# Patient Record
Sex: Male | Born: 1950 | Race: Black or African American | Hispanic: No | Smoking: Never smoker
Health system: Southern US, Community
[De-identification: ages and names within clinical notes are randomized; demographics above are authoritative.]

## PROBLEM LIST (undated history)

## (undated) DIAGNOSIS — I219 Acute myocardial infarction, unspecified: Secondary | ICD-10-CM

## (undated) DIAGNOSIS — I519 Heart disease, unspecified: Secondary | ICD-10-CM

## (undated) DIAGNOSIS — I10 Essential (primary) hypertension: Secondary | ICD-10-CM

## (undated) DIAGNOSIS — T7840XA Allergy, unspecified, initial encounter: Secondary | ICD-10-CM

## (undated) DIAGNOSIS — E785 Hyperlipidemia, unspecified: Secondary | ICD-10-CM

## (undated) DIAGNOSIS — E119 Type 2 diabetes mellitus without complications: Secondary | ICD-10-CM

## (undated) HISTORY — DX: Acute myocardial infarction, unspecified: I21.9

## (undated) HISTORY — DX: Allergy, unspecified, initial encounter: T78.40XA

## (undated) HISTORY — DX: Type 2 diabetes mellitus without complications: E11.9

## (undated) HISTORY — PX: CARDIAC CATHETERIZATION: SHX172

## (undated) HISTORY — DX: Hyperlipidemia, unspecified: E78.5

## (undated) HISTORY — DX: Heart disease, unspecified: I51.9

## (undated) HISTORY — DX: Essential (primary) hypertension: I10

## (undated) HISTORY — PX: OTHER SURGICAL HISTORY: SHX169

---

## 2016-09-04 ENCOUNTER — Encounter: Payer: Self-pay | Admitting: Family Medicine

## 2018-05-30 ENCOUNTER — Encounter: Payer: Self-pay | Admitting: Family Medicine

## 2019-05-21 ENCOUNTER — Other Ambulatory Visit: Payer: Self-pay

## 2019-05-21 DIAGNOSIS — Z20822 Contact with and (suspected) exposure to covid-19: Secondary | ICD-10-CM

## 2019-05-23 LAB — NOVEL CORONAVIRUS, NAA: SARS-CoV-2, NAA: NOT DETECTED

## 2019-05-25 ENCOUNTER — Telehealth: Payer: Self-pay | Admitting: General Practice

## 2019-05-25 NOTE — Telephone Encounter (Signed)
Negative COVID results given. Patient results "NOT Detected." Caller expressed understanding. ° °

## 2019-06-04 ENCOUNTER — Encounter: Payer: Self-pay | Admitting: *Deleted

## 2019-07-01 ENCOUNTER — Telehealth: Payer: Self-pay | Admitting: *Deleted

## 2019-07-01 ENCOUNTER — Other Ambulatory Visit: Payer: Self-pay

## 2019-07-01 ENCOUNTER — Ambulatory Visit (INDEPENDENT_AMBULATORY_CARE_PROVIDER_SITE_OTHER): Payer: Medicare Other | Admitting: Gastroenterology

## 2019-07-01 DIAGNOSIS — Z1211 Encounter for screening for malignant neoplasm of colon: Secondary | ICD-10-CM

## 2019-07-01 NOTE — Telephone Encounter (Signed)
Pt was scheduled for a nurse triage phone visit today.  Called pt and he said that he was unaware that we were so far away from [place] area.  He requested to have his procedure done there because it is closer for him.  Informed pt that I would pass the information along to his PCP so they could refer him to a GI doctor in his area.  LVMOM of his PCP ( Dr. Dorothey Baseman [phone]).

## 2019-07-02 DIAGNOSIS — Z1211 Encounter for screening for malignant neoplasm of colon: Secondary | ICD-10-CM | POA: Insufficient documentation

## 2019-07-02 NOTE — Progress Notes (Signed)
Patient was cancelled.

## 2019-09-29 LAB — PSA: PSA: 0.3

## 2019-09-29 LAB — LIPID PANEL
Cholesterol: 161 (ref 0–200)
HDL: 48 (ref 35–70)
LDL Cholesterol: 85
Triglycerides: 141 (ref 40–160)

## 2019-09-29 LAB — BASIC METABOLIC PANEL
BUN: 23 — AB (ref 4–21)
CO2: 24 — AB (ref 13–22)
Chloride: 102 (ref 99–108)
Creatinine: 1.4 — AB (ref 0.6–1.3)
Glucose: 148
Potassium: 4.1 (ref 3.4–5.3)
Sodium: 136 — AB (ref 137–147)

## 2019-09-29 LAB — HEPATIC FUNCTION PANEL
ALT: 11 (ref 10–40)
AST: 12 — AB (ref 14–40)

## 2019-09-29 LAB — VITAMIN B12: Vitamin B-12: 410

## 2019-09-29 LAB — TSH: TSH: 0.67 (ref 0.41–5.90)

## 2019-09-29 LAB — VITAMIN D 25 HYDROXY (VIT D DEFICIENCY, FRACTURES): Vit D, 25-Hydroxy: 48

## 2019-09-29 LAB — HEMOGLOBIN A1C: Hemoglobin A1C: 8.1

## 2019-10-09 LAB — CBC AND DIFFERENTIAL
HCT: 40 — AB (ref 41–53)
Hemoglobin: 13 — AB (ref 13.5–17.5)
Platelets: 158 (ref 150–399)
WBC: 5

## 2019-10-09 LAB — CBC: RBC: 4.53 (ref 3.87–5.11)

## 2019-10-22 ENCOUNTER — Ambulatory Visit: Payer: Medicare Other | Admitting: Family Medicine

## 2019-11-18 ENCOUNTER — Ambulatory Visit: Payer: Medicare Other | Admitting: Family Medicine

## 2019-12-07 ENCOUNTER — Other Ambulatory Visit: Payer: Self-pay

## 2019-12-07 ENCOUNTER — Encounter: Payer: Self-pay | Admitting: Family Medicine

## 2019-12-07 ENCOUNTER — Ambulatory Visit (INDEPENDENT_AMBULATORY_CARE_PROVIDER_SITE_OTHER): Payer: Medicare Other | Admitting: Family Medicine

## 2019-12-07 DIAGNOSIS — N183 Chronic kidney disease, stage 3 unspecified: Secondary | ICD-10-CM

## 2019-12-07 DIAGNOSIS — E1159 Type 2 diabetes mellitus with other circulatory complications: Secondary | ICD-10-CM | POA: Diagnosis not present

## 2019-12-07 DIAGNOSIS — E1122 Type 2 diabetes mellitus with diabetic chronic kidney disease: Secondary | ICD-10-CM

## 2019-12-07 DIAGNOSIS — I10 Essential (primary) hypertension: Secondary | ICD-10-CM | POA: Insufficient documentation

## 2019-12-07 DIAGNOSIS — E1169 Type 2 diabetes mellitus with other specified complication: Secondary | ICD-10-CM | POA: Diagnosis not present

## 2019-12-07 DIAGNOSIS — I252 Old myocardial infarction: Secondary | ICD-10-CM | POA: Insufficient documentation

## 2019-12-07 DIAGNOSIS — I152 Hypertension secondary to endocrine disorders: Secondary | ICD-10-CM | POA: Insufficient documentation

## 2019-12-07 DIAGNOSIS — E785 Hyperlipidemia, unspecified: Secondary | ICD-10-CM

## 2019-12-07 MED ORDER — BLOOD GLUCOSE METER KIT: PACK | 3 refills | Status: DC

## 2019-12-07 MED ORDER — PIOGLITAZONE HCL 45 MG PO TABS: 45.0000 mg | ORAL_TABLET | Freq: Every day | ORAL | 0 refills | Status: DC

## 2019-12-07 NOTE — Patient Instructions (Signed)
Great to meet you today!  #Referral I have placed a referral to a specialist for you. You should receive a phone call from the specialty office. Make sure your voicemail is not full and that if you are able to answer your phone to unknown or new numbers.   It may take up to 2 weeks to hear about the referral. If you do not hear anything in 2 weeks, please call our office and ask to speak with the referral coordinator.

## 2019-12-07 NOTE — Assessment & Plan Note (Addendum)
Reports 5 heart attacks and most recent was 5-10 years ago. Was following with cardiology, will try to get records. Will place referral for ongoing care. On high intensity statin, asa, and clopidogrel

## 2019-12-07 NOTE — Progress Notes (Signed)
Subjective:     Donald Skinner is a 69 y.o. male presenting for Establish Care (previous PCP Dr Ronnette Hila in [place] there was 09/2019) and Diabetes (would like glucose supplies and referral to Diabetic program)     HPI  #Diabetes - taking metformin and actos - no foot issues - out of testing supplies - last hgb a1c was kind of high - due to diet changes  #Hx of MI - told he needed plavix/ASA indefinitely  #HTN - no cp, sob, ha, no edema  Review of Systems   Social History   Tobacco Use  Smoking Status Never Smoker  Smokeless Tobacco Never Used        Objective:    BP Readings from Last 3 Encounters:  12/07/19 132/68   Wt Readings from Last 3 Encounters:  12/07/19 194 lb (88 kg)    BP 132/68   Pulse 69   Temp 98.2 F (36.8 C)   Resp (!) 22   Ht 5' 5.75" (1.67 m)   Wt 194 lb (88 kg)   SpO2 96%   BMI 31.55 kg/m    Physical Exam Constitutional:      Appearance: Normal appearance. He is not ill-appearing or diaphoretic.  HENT:     Right Ear: External ear normal.     Left Ear: External ear normal.  Eyes:     General: No scleral icterus.    Extraocular Movements: Extraocular movements intact.     Conjunctiva/sclera: Conjunctivae normal.  Cardiovascular:     Rate and Rhythm: Normal rate and regular rhythm.     Heart sounds: No murmur.  Pulmonary:     Effort: Pulmonary effort is normal. No respiratory distress.     Breath sounds: Normal breath sounds. No wheezing.  Musculoskeletal:     Cervical back: Neck supple.  Skin:    General: Skin is warm and dry.  Neurological:     Mental Status: He is alert. Mental status is at baseline.  Psychiatric:        Mood and Affect: Mood normal.           Assessment & Plan:   Problem List Items Addressed This Visit      Cardiovascular and Mediastinum   Hypertension associated with diabetes ([place])    BP controled. Cont carvedilol and amlodipine. Recent blood work at [facility] 1 month ago - will get  records.      Relevant Medications   metFORMIN (GLUCOPHAGE) 500 MG tablet   amLODipine (NORVASC) 5 MG tablet   carvedilol (COREG) 25 MG tablet   atorvastatin (LIPITOR) 80 MG tablet   ASPIRIN 81 PO   pioglitazone (ACTOS) 45 MG tablet     Endocrine   Type 2 diabetes mellitus with diabetic chronic kidney disease ([place])    Reports last HgbA1c was elevated - was following with endocrinology. Cont metformin and actos. Will get recent outside blood work. Encouraged diet adherence and referral to endocrinology for further management.       Relevant Medications   metFORMIN (GLUCOPHAGE) 500 MG tablet   atorvastatin (LIPITOR) 80 MG tablet   ASPIRIN 81 PO   pioglitazone (ACTOS) 45 MG tablet   blood glucose meter kit and supplies   Other Relevant Orders   Ambulatory referral to Endocrinology   Hyperlipidemia associated with type 2 diabetes mellitus ([place])    With hx of MI. Cont atrovastatin.       Relevant Medications   metFORMIN (GLUCOPHAGE) 500 MG tablet   atorvastatin (  LIPITOR) 80 MG tablet   ASPIRIN 81 PO   pioglitazone (ACTOS) 45 MG tablet     Other   History of MI (myocardial infarction)    Reports 5 heart attacks and most recent was 5-10 years ago. Was following with cardiology, will try to get records. Will place referral for ongoing care. On high intensity statin, asa, and clopidogrel      Relevant Orders   Ambulatory referral to Cardiology       Return in about 3 months (around 03/08/2020).  Lesleigh Noe, MD

## 2019-12-07 NOTE — Assessment & Plan Note (Signed)
Reports last HgbA1c was elevated - was following with endocrinology. Cont metformin and actos. Will get recent outside blood work. Encouraged diet adherence and referral to endocrinology for further management.

## 2019-12-07 NOTE — Assessment & Plan Note (Signed)
BP controled. Cont carvedilol and amlodipine. Recent blood work at [facility] 1 month ago - will get records.

## 2019-12-07 NOTE — Assessment & Plan Note (Signed)
With hx of MI. Cont atrovastatin.

## 2019-12-08 NOTE — Progress Notes (Signed)
New Outpatient Visit Date: 12/09/2019  Referring Provider: Lesleigh Noe, [place],  [place] [address]  Chief Complaint: Establish cardiology care  HPI:  Donald Skinner is a 69 y.o. male who is being seen today for the evaluation of coronary artery disease at the request of Dr. Einar Pheasant. He has a history of prior myocardial infarction and multiple PCI's, ischemic cardiomyopathy with LVEF of 35 to 40% by echo in 07/2016, hypertension, and diabetes mellitus.  His initial PCI was in 08/2001.  He has subsequently undergone PCI's to the LCx and RCA.  Most recent catheterization was in 07/2016 in the setting of NSTEMI, at which time he was noted to have severe diffuse multivessel CAD not amenable to PCI or CABG.  Donald Skinner moved from [place] to the [place] area about a month ago to be closer to his daughter and grandson.  He was previously followed by [facility] and reports that he has been doing well.  He denies chest pain, shortness of breath, palpitations, and edema.  He has brief orthostatic lightheadedness from time to time but has not fallen or passed out.  --------------------------------------------------------------------------------------------------  Cardiovascular History & Procedures: Cardiovascular Problems:  Coronary artery disease with prior MI  Risk Factors:  Known CAD, hypertension, diabetes mellitus, male gender, obesity, and age > 56  Cath/PCI:  PCI to mid LAD in 08/2001  PCI to proximal LAD and mid LCx in 06/2005  PCI to proximal LAD for ISR in 02/2010 (patient declined single-vessel CABG at that time  PCI to proximal LCx in setting of NSTEMI in 08/2012  PCI to proximal RCA in the setting of STEMI in 12/2012  LHC in 07/2016 demonstrating normal LMCA, diffuse 30% mid LAD stenosis and diffuse distal LAD disease of 70 to 80%, D1 with proximal diffuse 95% stenosis, ramus intermedius with ostial and proximal 90% stenosis and diffuse disease beyond, nondominant  LCx with patent proximal and mid vessel stents.  Severe diffuse 99% stenosis in the distal vessel.  RCA with 100% proximal occlusion with left-to-right collaterals.  CV Surgery:  None  EP Procedures and Devices:  None  Non-Invasive Evaluation(s):  TTE (08/28/2016): LVEF 35-40% with mild global hypokinesis and more severe hypokinesis of the inferior, inferoseptal, and mid anterior walls.  Mild mitral annular calcification and moderate mitral regurgitation.  Normal diastolic function.  Normal PA pressure.  --------------------------------------------------------------------------------------------------  Past Medical History:  Diagnosis Date  . Allergy   . Diabetes mellitus without complication ([place])   . Heart disease   . Hyperlipidemia   . Hypertension   . Myocardial infarction [facility])     Past Surgical History:  Procedure Laterality Date  . Cardiac stents     x 6 stents- had 5 heart attacks    Current Meds  Medication Sig  . amLODipine (NORVASC) 5 MG tablet Take 5 mg by mouth daily.  . ASPIRIN 81 PO Take by mouth. daily  . atorvastatin (LIPITOR) 80 MG tablet Take 80 mg by mouth daily.  . blood glucose meter kit and supplies Dispense based on patient and insurance preference. Use up to four times daily as directed. (FOR ICD-10 E10.9, E11.9).  . carvedilol (COREG) 25 MG tablet Take 25 mg by mouth 2 (two) times daily with a meal.  . clopidogrel (PLAVIX) 75 MG tablet Take 75 mg by mouth daily.  . metFORMIN (GLUCOPHAGE) 500 MG tablet Take 500 mg by mouth 2 (two) times daily with a meal.  . pioglitazone (ACTOS) 45 MG tablet  Take 1 tablet (45 mg total) by mouth daily.  Marland Kitchen VITAMIN D PO Take 50 mcg by mouth daily.    Allergies: Patient has no allergy information on record.  Social History   Tobacco Use  . Smoking status: Never Smoker  . Smokeless tobacco: Never Used  Substance Use Topics  . Alcohol use: Not Currently  . Drug use: Never    Family History  Problem Relation  Age of Onset  . Alcohol abuse Mother   . Arthritis Mother   . Alcohol abuse Father   . Heart attack Father   . Hypertension Father   . Alcohol abuse Brother   . Drug abuse Brother   . Alcohol abuse Brother   . Mental illness Brother     Review of Systems: A 12-system review of systems was performed and was negative except as noted in the HPI.  --------------------------------------------------------------------------------------------------  Physical Exam: BP 130/66 (BP Location: Right Arm, Patient Position: Sitting, Cuff Size: Normal)   Pulse 66   Ht 5' 6" (1.676 m)   Wt 192 lb (87.1 kg)   SpO2 96%   BMI 30.99 kg/m   General: NAD. HEENT: No conjunctival pallor or scleral icterus. Facemask in place. Neck: Supple without lymphadenopathy, thyromegaly, JVD, or HJR. No carotid bruit. Lungs: Normal work of breathing. Clear to auscultation bilaterally without wheezes or crackles. Heart: Regular rate and rhythm without murmurs, rubs, or gallops. Non-displaced PMI. Abd: Bowel sounds present. Soft, NT/ND without hepatosplenomegaly Ext: No lower extremity edema. Radial, PT, and DP pulses are 2+ bilaterally Skin: Warm and dry without rash. Neuro: CNIII-XII intact. Strength and fine-touch sensation intact in upper and lower extremities bilaterally. Psych: Normal mood and affect.  EKG: Normal sinus rhythm with inferior Q waves and lateral T wave inversions.  --------------------------------------------------------------------------------------------------  ASSESSMENT AND PLAN: Coronary artery disease with stable angina: Donald Skinner has been doing well without chest pain or dyspnea despite significant multivessel CAD that was deemed on revascularizable at the time of his most recent catheterization in 2018.  He was previously on multiple antianginal medications but currently is taking just carvedilol and amlodipine.  I think it is reasonable to maintain this regimen as well as indefinite  dual antiplatelet therapy in the setting of multiple PCI's and extensive CAD.  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  His only evidence-based heart failure therapy at this time is carvedilol.  Most recent cardiology note from [place] in 07/2018 also makes note of losartan.  Donald Skinner is uncertain why this medication may have been discontinued.  We will request most recent labs from his PCP to ensure that his renal function and electrolytes are normal.  I would favor rechallenging him with an ARB in the future to optimize his evidence-based heart failure therapy.  Hypertension: Blood pressure borderline elevated today (goal less than 130/80).  I encouraged Donald Skinner to minimize his sodium intake.  We will consider adding an ARB after review of his outside labs.  Hyperlipidemia: Patient reports intolerance to statins in the past but is now taking atorvastatin without difficulty.  We will request most recent labs, including fasting lipid panel.  We will plan to repeat a lipid panel before he follows up with [place] in about 6 months.  Follow-up: Return to clinic in 6 months.  Nelva Bush, MD 12/09/2019 3:55 PM

## 2019-12-09 ENCOUNTER — Other Ambulatory Visit: Payer: Self-pay

## 2019-12-09 ENCOUNTER — Encounter: Payer: Self-pay | Admitting: Internal Medicine

## 2019-12-09 ENCOUNTER — Ambulatory Visit (INDEPENDENT_AMBULATORY_CARE_PROVIDER_SITE_OTHER): Payer: Medicare Other | Admitting: Internal Medicine

## 2019-12-09 VITALS — BP 130/66 | HR 66 | Ht 66.0 in | Wt 192.0 lb

## 2019-12-09 DIAGNOSIS — I25118 Atherosclerotic heart disease of native coronary artery with other forms of angina pectoris: Secondary | ICD-10-CM | POA: Diagnosis not present

## 2019-12-09 DIAGNOSIS — Z79899 Other long term (current) drug therapy: Secondary | ICD-10-CM

## 2019-12-09 DIAGNOSIS — I152 Hypertension secondary to endocrine disorders: Secondary | ICD-10-CM

## 2019-12-09 DIAGNOSIS — I10 Essential (primary) hypertension: Secondary | ICD-10-CM

## 2019-12-09 DIAGNOSIS — E1159 Type 2 diabetes mellitus with other circulatory complications: Secondary | ICD-10-CM | POA: Diagnosis not present

## 2019-12-09 DIAGNOSIS — I5022 Chronic systolic (congestive) heart failure: Secondary | ICD-10-CM

## 2019-12-09 DIAGNOSIS — E785 Hyperlipidemia, unspecified: Secondary | ICD-10-CM

## 2019-12-09 DIAGNOSIS — E1169 Type 2 diabetes mellitus with other specified complication: Secondary | ICD-10-CM

## 2019-12-09 NOTE — Patient Instructions (Signed)
Medication Instructions:  Your physician recommends that you continue on your current medications as directed. Please refer to the Current Medication list given to you today.  *If you need a refill on your cardiac medications before your next appointment, please call your pharmacy*   Lab Work: Your physician recommends that you return for lab work in: 6 months prior to your follow up appointment in office.   ~ November 15th, 2021. - You will need to be fasting. Please do not have anything to eat or drink after midnight the morning you have the lab work. You may only have water or black coffee with no cream or sugar. - Please go to the [facility]. You will check in at the front desk to the right as you walk into the atrium. Valet Parking is offered if needed. - No appointment needed. You may go any day between 7 am and 6 pm.  If you have labs (blood work) drawn today and your tests are completely normal, you will receive your results only by: Marland Kitchen MyChart Message (if you have MyChart) OR . A paper copy in the mail If you have any lab test that is abnormal or we need to change your treatment, we will call you to review the results.   Testing/Procedures: none   Follow-Up: At [facility], you and your health needs are our priority.  As part of our continuing mission to provide you with exceptional heart care, we have created designated Provider Care Teams.  These Care Teams include your primary Cardiologist (physician) and Advanced Practice Providers (APPs -  Physician Assistants and Nurse Practitioners) who all work together to provide you with the care you need, when you need it.  We recommend signing up for the patient portal called "MyChart".  Sign up information is provided on this After Visit Summary.  MyChart is used to connect with patients for Virtual Visits (Telemedicine).  Patients are able to view lab/test results, encounter notes, upcoming appointments, etc.  Non-urgent  messages can be sent to your provider as well.   To learn more about what you can do with MyChart, go to [email or web address].    Your next appointment:   6 month(s)  Make sure you have lab work within one week prior to appt.   The format for your next appointment:   In Person  Provider:    You may see DR Cristal Deer END or one of the following Advanced Practice Providers on your designated Care Team:    Nicolasa Ducking, NP  Eula Listen, PA-C  Marisue Ivan, PA-C

## 2019-12-10 DIAGNOSIS — I5022 Chronic systolic (congestive) heart failure: Secondary | ICD-10-CM | POA: Insufficient documentation

## 2019-12-10 DIAGNOSIS — I25118 Atherosclerotic heart disease of native coronary artery with other forms of angina pectoris: Secondary | ICD-10-CM | POA: Insufficient documentation

## 2019-12-17 ENCOUNTER — Encounter: Payer: Self-pay | Admitting: Family Medicine

## 2019-12-17 ENCOUNTER — Telehealth: Payer: Self-pay | Admitting: Family Medicine

## 2019-12-17 DIAGNOSIS — N2889 Other specified disorders of kidney and ureter: Secondary | ICD-10-CM | POA: Insufficient documentation

## 2019-12-17 NOTE — Telephone Encounter (Signed)
Left voicemail an asked patient to call back.   If patient returns the call please ask him what he understands about his mass on his kidney and if he has seen anyone for this.    Reviewing outside records and noted to have a renal Ultrasound with mass concerning for renal cell carcinoma (cancer). Would recommend referral for further evaluation.

## 2020-01-11 NOTE — Telephone Encounter (Signed)
Urgent referral for urology. Records has previously been sent for scanning though not available in Epic at this time. May need to request outside records again to have copy for Urology

## 2020-01-11 NOTE — Telephone Encounter (Signed)
Patient returned phone call. He states he has heard that he has a mass on his kidney, but he does not understand what all the doctors tell him.  The patient then started saying how his doctors will not talk to him about it... Patient states he is ok with a referral to discuss this - he prefers Educational psychologist.

## 2020-01-11 NOTE — Addendum Note (Signed)
Addended by: Gweneth Dimitri R on: 01/11/2020 12:23 PM   Modules accepted: Orders

## 2020-01-12 ENCOUNTER — Ambulatory Visit: Payer: Medicare Other | Admitting: Urology

## 2020-01-15 ENCOUNTER — Encounter: Payer: Self-pay | Admitting: Urology

## 2020-01-15 ENCOUNTER — Ambulatory Visit (INDEPENDENT_AMBULATORY_CARE_PROVIDER_SITE_OTHER): Payer: Medicare Other | Admitting: Urology

## 2020-01-15 ENCOUNTER — Other Ambulatory Visit: Payer: Self-pay

## 2020-01-15 VITALS — BP 135/76 | HR 69 | Ht 65.0 in | Wt 186.0 lb

## 2020-01-15 DIAGNOSIS — N2889 Other specified disorders of kidney and ureter: Secondary | ICD-10-CM

## 2020-01-15 NOTE — Progress Notes (Signed)
01/15/2020 9:43 AM   Donald Skinner July 02, 1951 [id number]  Referring provider: Lesleigh Noe, MD [place],  [place] [address]  Chief Complaint  Patient presents with  . Other    HPI: Donald Skinner is a 69 yo male seen at the request of Dr. Einar Pheasant for evaluation of a left renal mass  -Recently moved to the [place] area [place] to be closer to his daughter -Recently saw Dr. Einar Pheasant to establish care and prior records were received -Renal ultrasound performed 09/30/2019 which was performed in [place] remarkable for a 6.3 x 6.3 x 6.8 cm left renal mass -Prior MRI from 05/30/2018 showed mass measuring 7.1 x 7.4 cm -Images not available but felt highly suspicious for renal cell carcinoma -Denies flank or abdominal pain -No dysuria, gross hematuria -Denies prior urologic evaluation for this mass -He was here alone today -History of prior MI with multiple PCI's; ischemic cardiomyopathy with LVEF 35-40% -Severe multivessel CAD not amenable to PCI or CABG -On Plavix -PSA 09/2019 0.3   PMH: Past Medical History:  Diagnosis Date  . Allergy   . Diabetes mellitus without complication ([place])   . Heart disease   . Hyperlipidemia   . Hypertension   . Myocardial infarction [facility])     Surgical History: Past Surgical History:  Procedure Laterality Date  . Cardiac stents     x 6 stents- had 5 heart attacks    Home Medications:  Allergies as of 01/15/2020      Reactions   Lipitor [atorvastatin]    Lisinopril Cough   Rosuvastatin Other (See Comments)   myalgias   Simvastatin Other (See Comments)   weakness      Medication List       Accurate as of January 15, 2020  9:43 AM. If you have any questions, ask your nurse or doctor.        amLODipine 5 MG tablet Commonly known as: NORVASC Take 5 mg by mouth daily.   ASPIRIN 81 PO Take by mouth. daily   atorvastatin 80 MG tablet Commonly known as: LIPITOR Take 80 mg by mouth daily.   blood glucose meter kit and  supplies Dispense based on patient and insurance preference. Use up to four times daily as directed. (FOR ICD-10 E10.9, E11.9).   carvedilol 25 MG tablet Commonly known as: COREG Take 25 mg by mouth 2 (two) times daily with a meal.   clopidogrel 75 MG tablet Commonly known as: PLAVIX Take 75 mg by mouth daily.   metFORMIN 500 MG tablet Commonly known as: GLUCOPHAGE Take 500 mg by mouth 2 (two) times daily with a meal.   nitroGLYCERIN 0.4 MG SL tablet Commonly known as: NITROSTAT Place 0.4 mg under the tongue every 5 (five) minutes as needed for chest pain.   pioglitazone 45 MG tablet Commonly known as: ACTOS Take 1 tablet (45 mg total) by mouth daily.   VITAMIN D PO Take 50 mcg by mouth daily.       Allergies:  Allergies  Allergen Reactions  . Lipitor [Atorvastatin]   . Lisinopril Cough  . Rosuvastatin Other (See Comments)    myalgias  . Simvastatin Other (See Comments)    weakness    Family History: Family History  Problem Relation Age of Onset  . Alcohol abuse Mother   . Arthritis Mother   . Alcohol abuse Father   . Heart attack Father 20  . Hypertension Father   . Alcohol abuse Brother   . Drug  abuse Brother   . Alcohol abuse Brother   . Mental illness Brother     Social History:  reports that he has never smoked. He has never used smokeless tobacco. He reports previous alcohol use. He reports that he does not use drugs.   Physical Exam: BP 135/76   Pulse 69   Ht 5' 5" (1.651 m)   Wt 186 lb (84.4 kg)   BMI 30.95 kg/m   Constitutional:  Alert and oriented, No acute distress. HEENT: [place] AT, moist mucus membranes.  Trachea midline, no masses. Cardiovascular: No clubbing, cyanosis, or edema. Respiratory: Normal respiratory effort, no increased work of breathing. GI: Abdomen is soft, nontender, nondistended, no abdominal masses GU: No CVA tenderness Lymph: No cervical or inguinal lymphadenopathy. Skin: No rashes, bruises or suspicious  lesions. Neurologic: Grossly intact, no focal deficits, moving all 4 extremities. Psychiatric: Normal mood and affect.  Laboratory Data: Lab Results  Component Value Date   WBC 5.0 10/09/2019   HGB 13.0 (A) 10/09/2019   HCT 40 (A) 10/09/2019   PLT 158 10/09/2019    Lab Results  Component Value Date   CREATININE 1.4 (A) 09/29/2019    Lab Results  Component Value Date   PSA 0.3 09/29/2019     Assessment & Plan:    1.  Left renal mass Large left renal mass by records dating back to 2019.  Denies previous urologic evaluation however he also denied ever having MRI.  I discussed that this mass is highly suspicious for renal cell carcinoma based on the report.  The images were not available for review.  Based on size there is a greater likelihood of metastasis without treatment.  I will contact his daughter who is on his DPR to see if she can provide any additional information regarding past evaluation of this mass.   Abbie Sons, [place] [address], [place], [place] [address] [phone]

## 2020-01-21 ENCOUNTER — Other Ambulatory Visit: Payer: Self-pay

## 2020-01-21 ENCOUNTER — Encounter: Payer: Self-pay | Admitting: Urology

## 2020-01-21 ENCOUNTER — Telehealth: Payer: Self-pay | Admitting: Urology

## 2020-01-21 DIAGNOSIS — N2889 Other specified disorders of kidney and ureter: Secondary | ICD-10-CM

## 2020-01-21 MED ORDER — METFORMIN HCL 500 MG PO TABS: 500.0000 mg | ORAL_TABLET | Freq: Two times a day (BID) | ORAL | 1 refills | Status: DC

## 2020-01-21 NOTE — Telephone Encounter (Signed)
Patient's daughter-Constance Dray, returned your call. Concerned with getting his medical records-she is unaware of his past history of renal mass, could not provide further information. Stated Dr. Murvin Natal was his previous physician, has since retired.

## 2020-01-21 NOTE — Telephone Encounter (Signed)
Patient's daughter (on [place]) contacted regarding past history of renal mass.  Voicemail left.

## 2020-01-25 NOTE — Telephone Encounter (Signed)
I did get a message from his current PCP Dr. Selena Batten who stated his former PCP tried to get him treatment for this mass.  He has an appointment with Dr. Apolinar Junes on 7/14 to discuss treatment.  Since we do not have any radiographic imaging for review we will put in an order for a renal mass protocol MRI.

## 2020-01-25 NOTE — Telephone Encounter (Signed)
Patient's daughter [name] [phone]) called stating that she is unable to get information from Dr. Rosamaria Lints office and is not sure of her father's history herself. She wants to know where they go from here for treatment. She does not want his care to be delayed any longer.

## 2020-01-26 NOTE — Telephone Encounter (Signed)
Left a mess for [name] to call

## 2020-01-26 NOTE — Telephone Encounter (Signed)
Patient's daughter was notified. She states she still has had no success in contacting Dr. Duane Lope office and would like for [place] to have the records for review at the follow up so that treatment options can be discussed.  Called [phone]) and spoke with [place] at Dr. Duane Lope office and requested records of the MRI report along with images and offices notes from 05/2018 be sent to our office. She states she will send a message back and call us with an update

## 2020-01-31 ENCOUNTER — Other Ambulatory Visit: Payer: Self-pay | Admitting: Family Medicine

## 2020-01-31 DIAGNOSIS — N183 Chronic kidney disease, stage 3 unspecified: Secondary | ICD-10-CM

## 2020-01-31 DIAGNOSIS — E1122 Type 2 diabetes mellitus with diabetic chronic kidney disease: Secondary | ICD-10-CM

## 2020-02-05 ENCOUNTER — Telehealth: Payer: Self-pay | Admitting: Family Medicine

## 2020-02-05 NOTE — Telephone Encounter (Signed)
Patient's daughter called inquiring about the Ct scan she has not heard from the imaging department. The Ct has been ordered and we are working on getting the appointment in the next couple days.

## 2020-02-10 ENCOUNTER — Ambulatory Visit: Payer: Medicare Other | Admitting: Urology

## 2020-02-19 ENCOUNTER — Other Ambulatory Visit: Payer: Self-pay

## 2020-02-19 DIAGNOSIS — T1590XD Foreign body on external eye, part unspecified, unspecified eye, subsequent encounter: Secondary | ICD-10-CM

## 2020-02-22 ENCOUNTER — Ambulatory Visit
Admission: RE | Admit: 2020-02-22 | Discharge: 2020-02-22 | Disposition: A | Discharge status: Home / Self Care | Payer: Medicare Other | Source: Ambulatory Visit | Attending: Urology | Admitting: Urology

## 2020-02-22 ENCOUNTER — Other Ambulatory Visit: Payer: Self-pay

## 2020-02-22 DIAGNOSIS — N2889 Other specified disorders of kidney and ureter: Secondary | ICD-10-CM

## 2020-02-22 DIAGNOSIS — T1590XD Foreign body on external eye, part unspecified, unspecified eye, subsequent encounter: Secondary | ICD-10-CM | POA: Insufficient documentation

## 2020-02-22 IMAGING — CR DG ORBITS FOR FOREIGN BODY
1 series · 2 of 2 positions shown · non-contrast
Comparison: None.

CLINICAL DATA: Metal working/exposure; clearance prior to MRI.
69-year-old male

EXAM:
ORBITS FOR FOREIGN BODY - 2 VIEW

[Series 1: dg eye foreign body · 0.14mm/px · 2 of 2 slices shown]
[im 1/2]
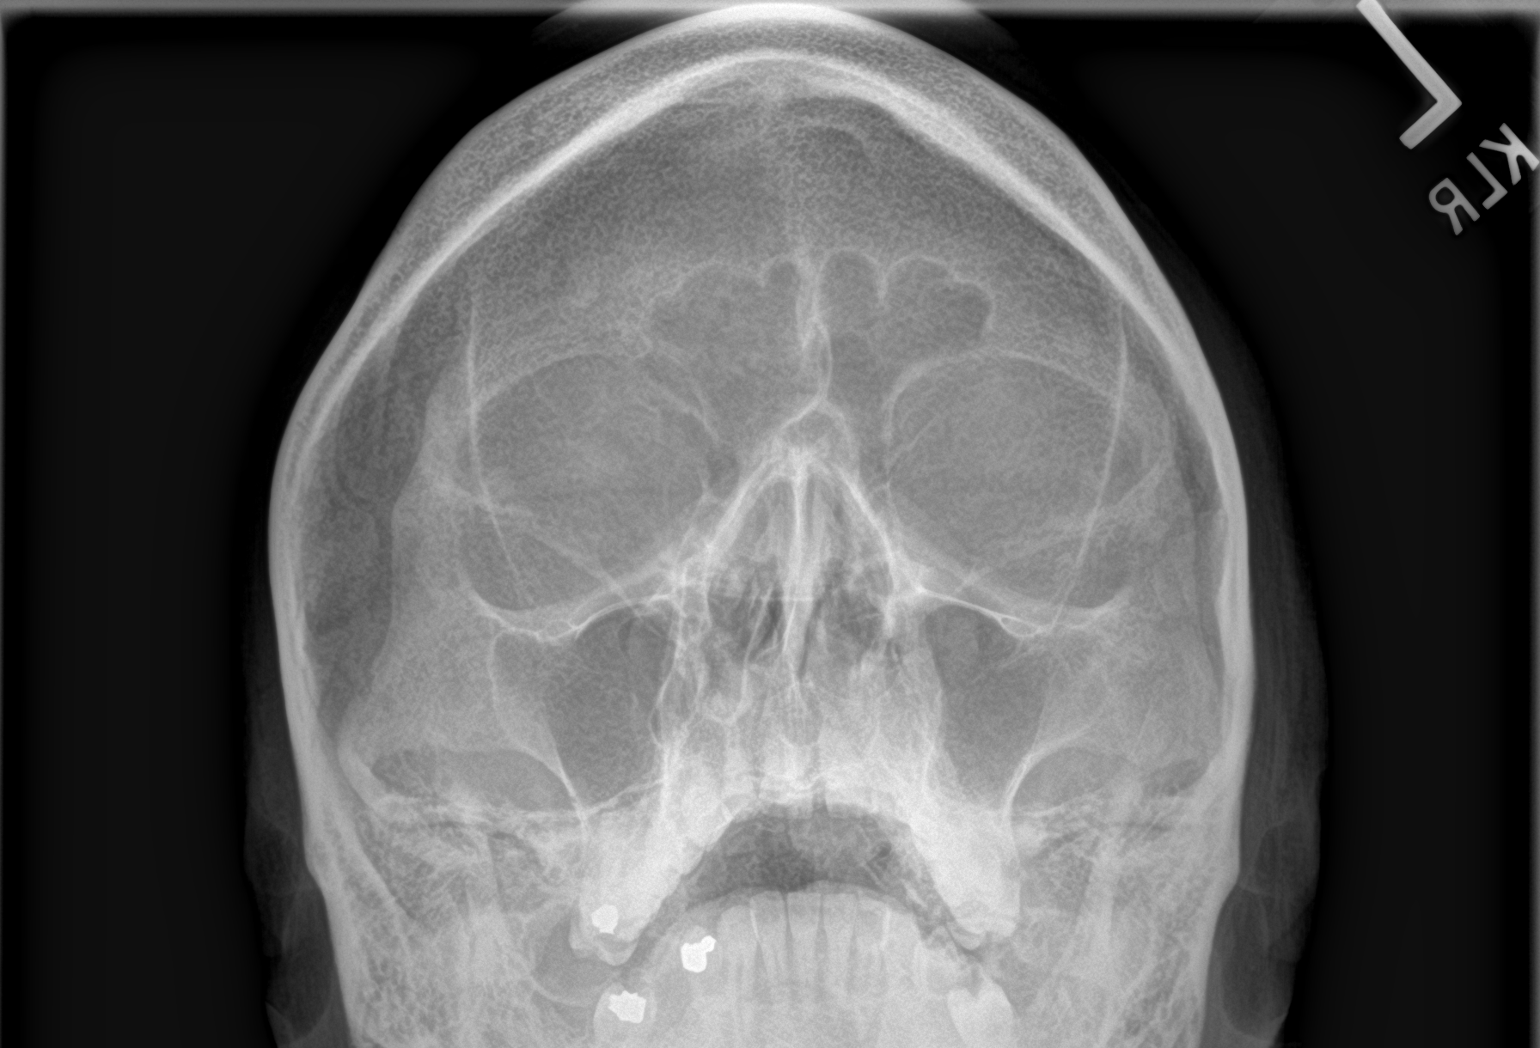
[im 2/2]
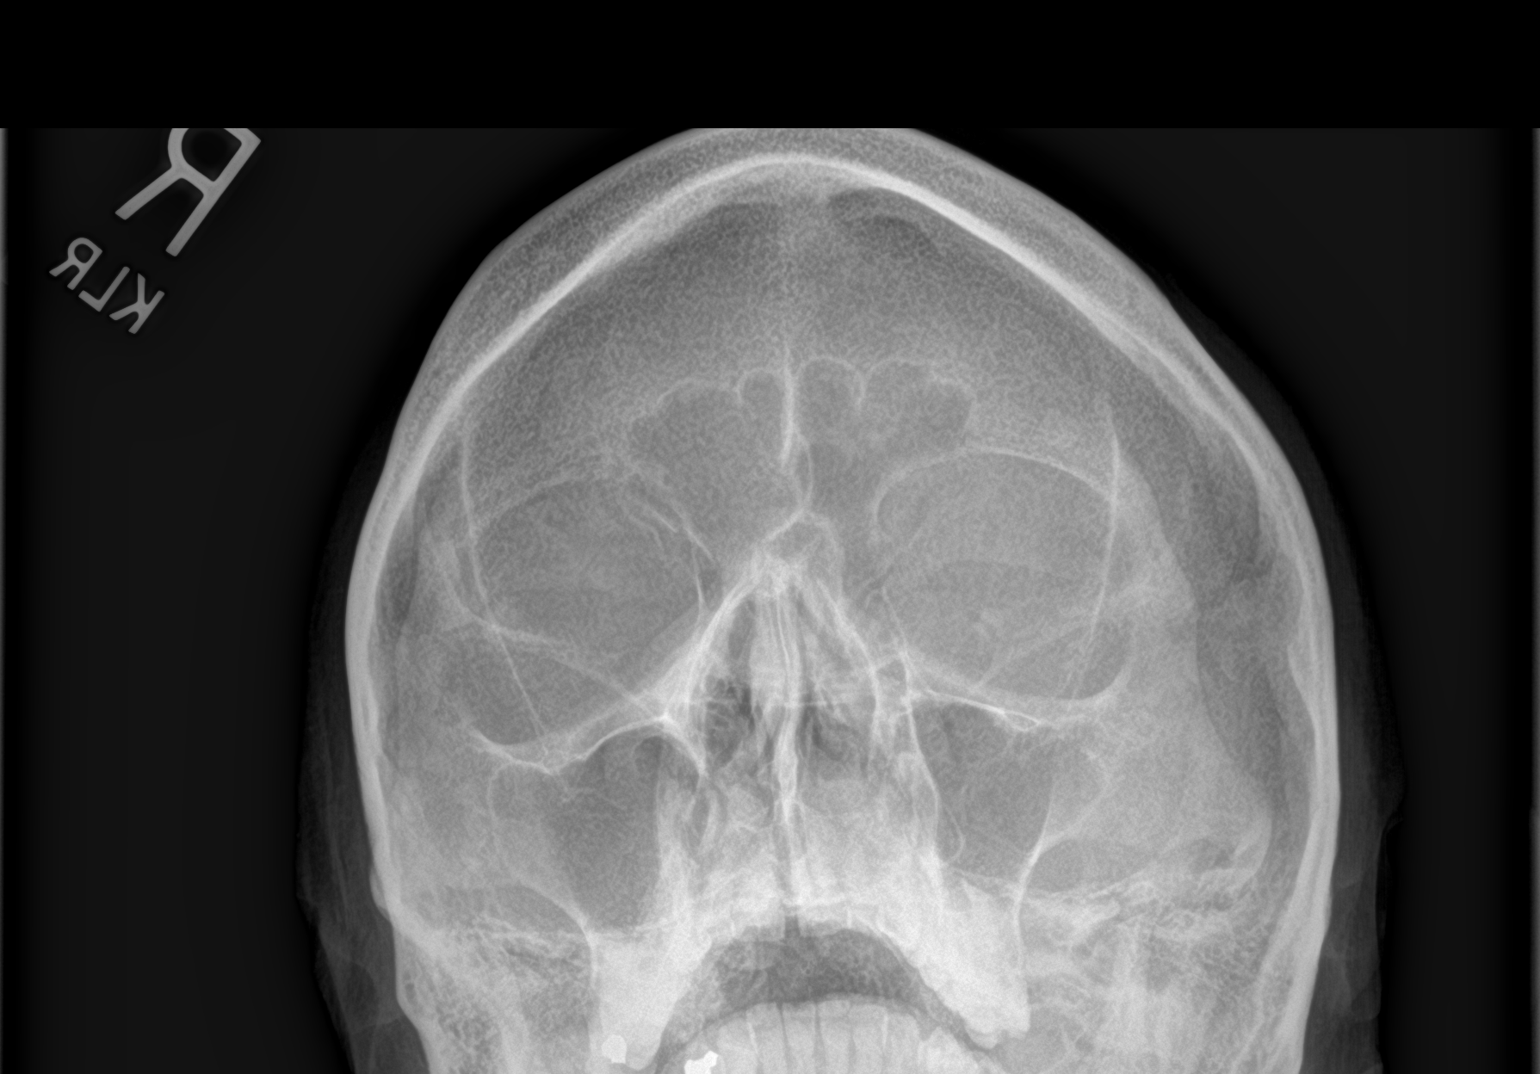

[2 of 2 positions shown; findings below may reference images not displayed]

FINDINGS: There is no evidence of metallic foreign body within the orbits. No
significant bone abnormality identified.
IMPRESSION: No evidence of metallic foreign body within the orbits.

## 2020-02-22 IMAGING — MR MR ABDOMEN WO/W CM
17 series · 48 of 48 positions shown · IV contrast (gadavist)
Comparison: None.

CLINICAL DATA: Left renal mass.

EXAM:
MRI ABDOMEN WITHOUT AND WITH CONTRAST
TECHNIQUE: Multiplanar multisequence MR imaging of the abdomen was performed
both before and after the administration of intravenous contrast.
CONTRAST:  8mL GADAVIST GADOBUTROL 1 MMOL/ML IV SOLN

[Series 3: cor haste · coronal · 6.0mm · 1.19mm/px · 2 of 36 slices shown]
[im 1/36]
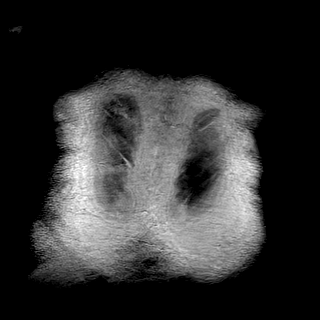
[im 36/36]
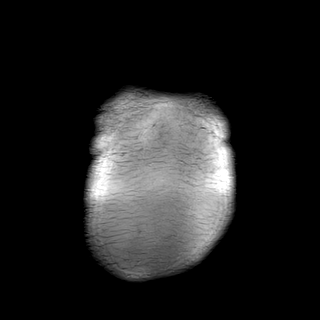

[Series 5: T2 fat-sat · axial · 6.0mm · 1.19mm/px · z∈[-183,+112]mm · 2 of 42 slices shown]
[im 1/42]
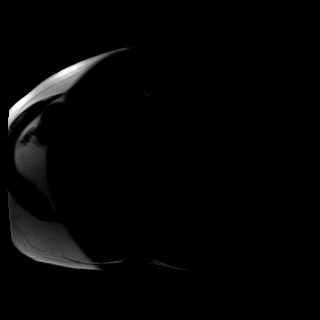
[im 42/42]
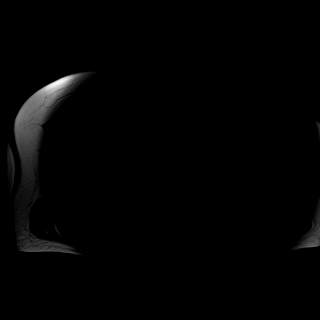

[Series 7: DWI · axial · 6.0mm · 1.42mm/px · z∈[-183,+112]mm · 5 of 126 slices shown (1 of 2)]
[im 1/126]
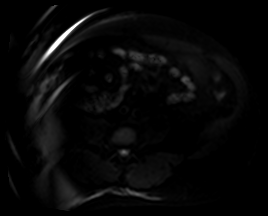
[im 32/126]
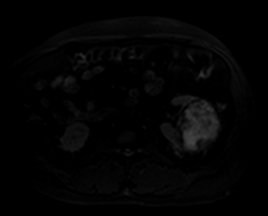
[im 63/126]
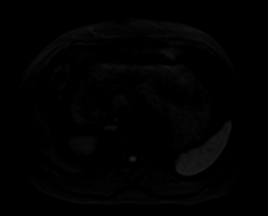
[im 94/126]
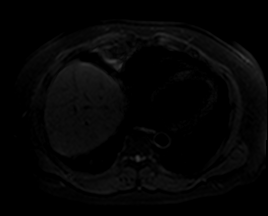
[im 126/126]
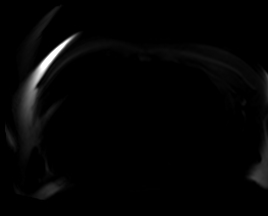

[Series 8: DWI · axial · 6.0mm · 1.42mm/px · 1 of 42 slices shown (2 of 2)]
[im 1/42]
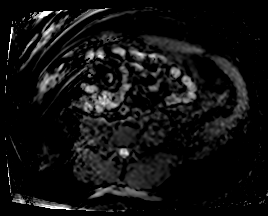

[Series 9: ax in & · axial · 3.5mm · 1.19mm/px · z∈[-194,+110]mm · 6 of 176 slices shown]
[im 1/176]
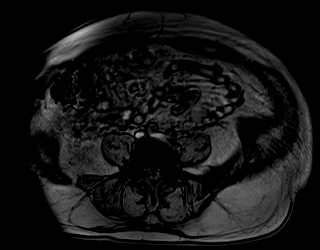
[im 36/176]
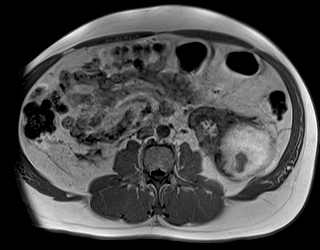
[im 71/176]
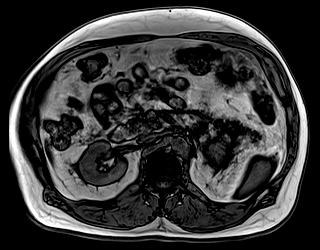
[im 106/176]
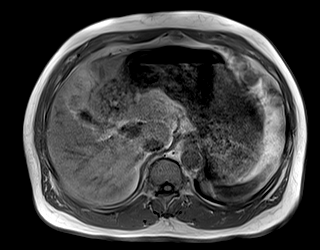
[im 141/176]
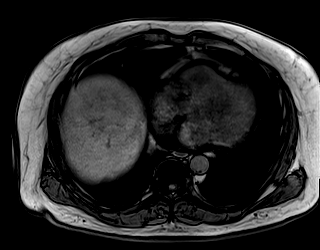
[im 176/176]
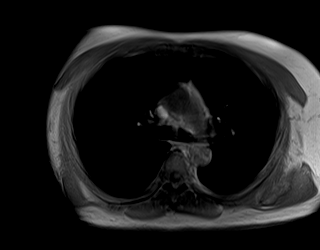

[Series 11: T1 dynamic · axial · non-contrast · 3.5mm · 1.19mm/px · z∈[-194,+110]mm · 3 of 88 slices shown (1 of 9)]
[im 1/88]
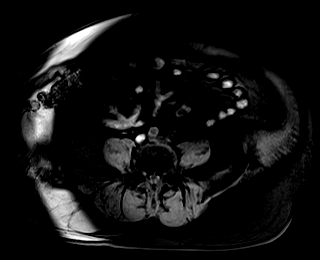
[im 44/88]
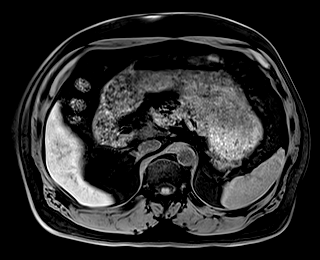
[im 88/88]
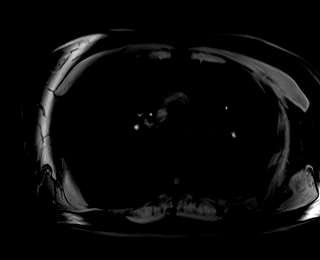

[Series 12: bSSFP · axial · 6.0mm · 0.74mm/px · 1 of 42 slices shown]
[im 1/42]
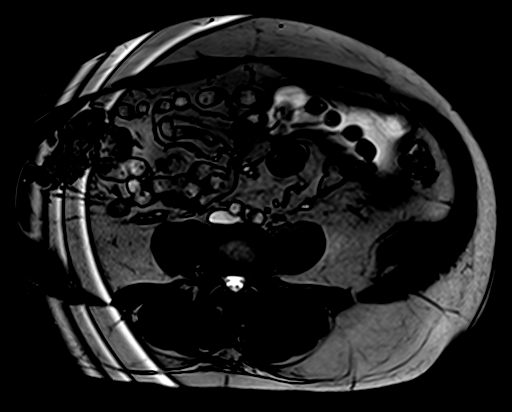

[Series 13: T1 dynamic · axial · 3.5mm · 1.19mm/px · z∈[-194,+110]mm · 3 of 88 slices shown (2 of 9)]
[im 1/88]
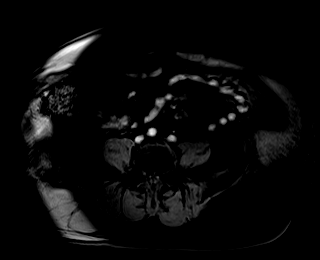
[im 44/88]
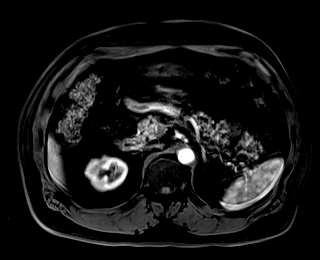
[im 88/88]
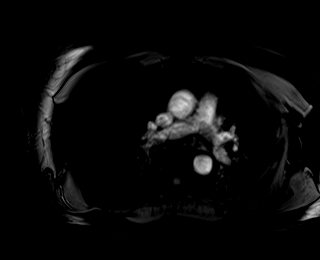

[Series 14: T1 dynamic · axial · 3.5mm · 1.19mm/px · z∈[-194,+110]mm · 3 of 88 slices shown (3 of 9)]
[im 1/88]
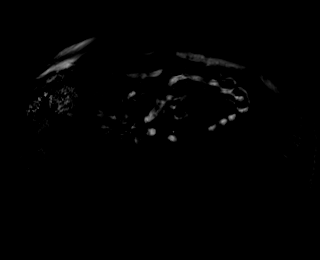
[im 44/88]
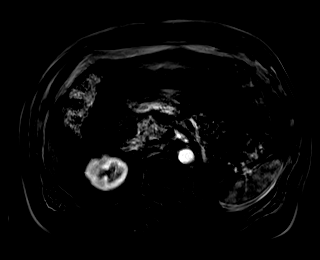
[im 88/88]
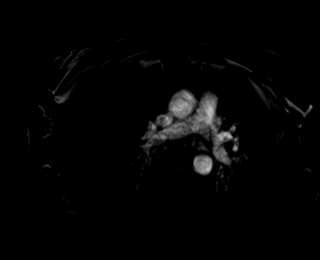

[Series 15: T1 dynamic · axial · 3.5mm · 1.19mm/px · z∈[-194,+110]mm · 3 of 88 slices shown (4 of 9)]
[im 1/88]
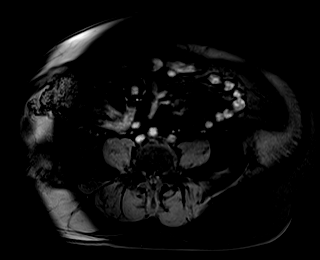
[im 44/88]
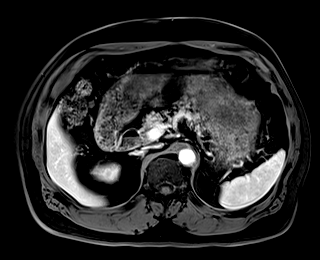
[im 88/88]
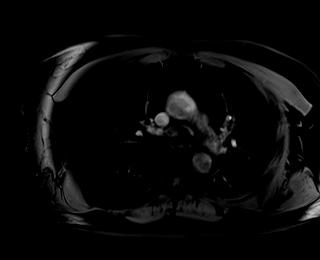

[Series 16: T1 dynamic · axial · 3.5mm · 1.19mm/px · z∈[-194,+110]mm · 3 of 88 slices shown (5 of 9)]
[im 1/88]
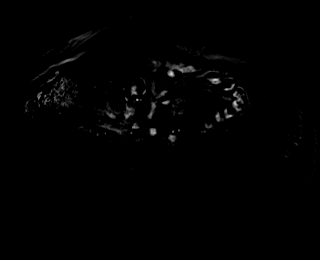
[im 44/88]
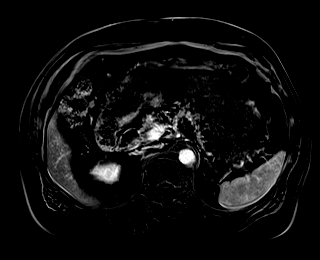
[im 88/88]
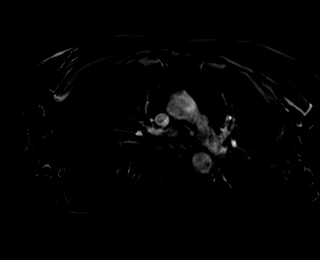

[Series 17: T1 dynamic · axial · 3.5mm · 1.19mm/px · z∈[-194,+110]mm · 3 of 88 slices shown (6 of 9)]
[im 1/88]
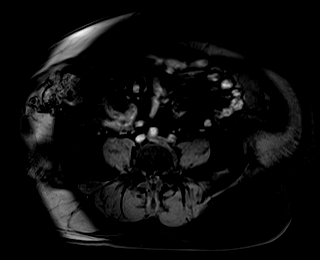
[im 44/88]
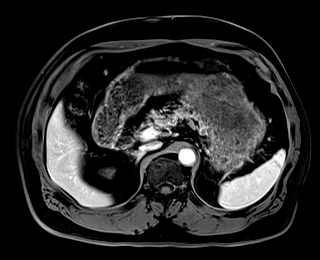
[im 88/88]
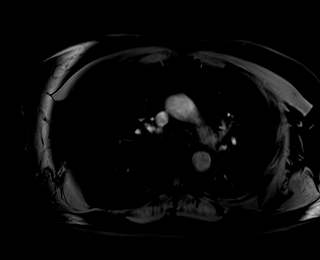

[Series 18: T1 dynamic · axial · 3.5mm · 1.19mm/px · z∈[-194,+110]mm · 3 of 88 slices shown (7 of 9)]
[im 1/88]
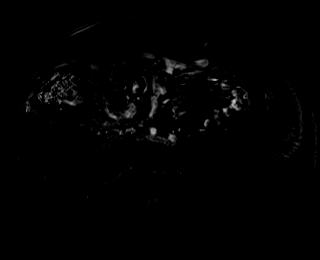
[im 44/88]
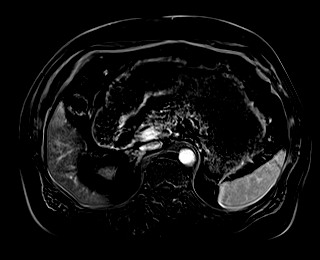
[im 88/88]
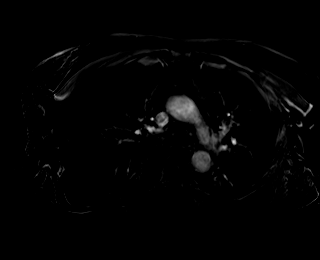

[Series 19: T1 dynamic post-contrast · coronal · 3.0mm · 1.31mm/px · 3 of 80 slices shown]
[im 1/80]
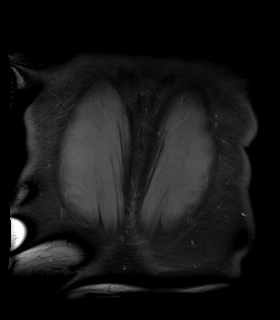
[im 40/80]
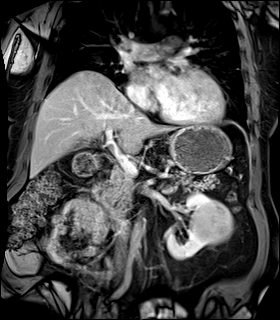
[im 80/80]
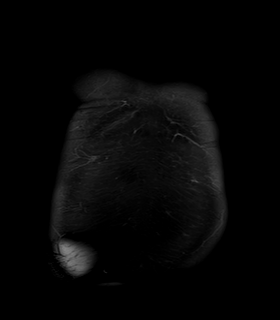

[Series 20: T2 · axial · 6.0mm · 1.19mm/px · 1 of 42 slices shown]
[im 1/42]
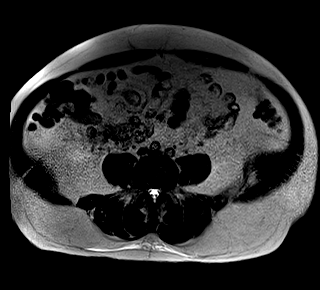

[Series 21: T1 dynamic · axial · 3.5mm · 1.19mm/px · z∈[-194,+110]mm · 3 of 88 slices shown (8 of 9)]
[im 1/88]
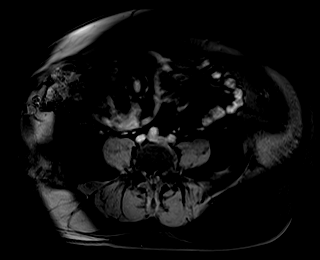
[im 44/88]
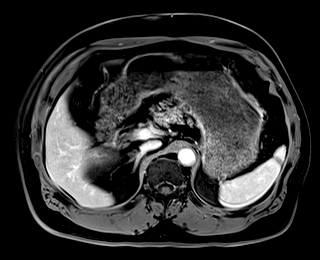
[im 88/88]
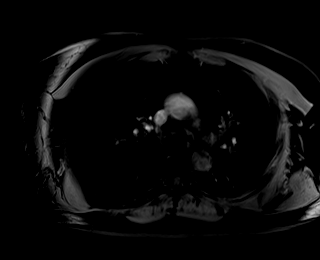

[Series 22: T1 dynamic · axial · 3.5mm · 1.19mm/px · z∈[-194,+110]mm · 3 of 88 slices shown (9 of 9)]
[im 1/88]
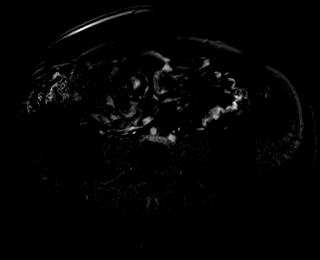
[im 44/88]
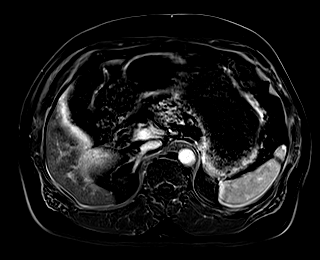
[im 88/88]
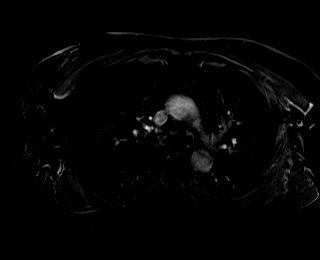

[48 of 48 positions shown; findings below may reference images not displayed]

FINDINGS: Lower chest: No acute findings.

Hepatobiliary: No hepatic masses identified. Gallbladder is
unremarkable. No evidence of biliary ductal dilatation.

Pancreas:  No mass or inflammatory changes.

Spleen:  Within normal limits in size and appearance.

Adrenals/Urinary Tract: No adrenal masses are identified. Right
kidney is normal in appearance. A lesion is seen in the lateral
midpole of the left kidney, which measures 7.8 x 7.0 cm on image
68/11. this lesion shows heterogeneous T1 and T2 hyperintensity,
without evidence of internal fat, as well as areas of T2
hypointensity suggesting hemosiderin. Subtraction imaging shows a
focus of increased intensity in the posterior aspect of this lesion,
however this is favored to be artifact due to heterogeneous nature
of lesion rather than true contrast enhancement. No evidence of
hydronephrosis.

Stomach/Bowel: Diverticulosis is seen involving the visualized
portion of the colon. No evidence of diverticulitis in these areas.

Vascular/Lymphatic: No pathologically enlarged lymph nodes
identified. No evidence of renal vein or IVC thrombus. No abdominal
aortic aneurysm.

Other:  None.

Musculoskeletal:  No suspicious bone lesions identified.
IMPRESSION: 7.8 cm complex hemorrhagic cystic lesion in the lateral midpole of
the left kidney. No definite contrast enhancement is seen, with
signal intensity on subtraction imaging likely representing
artifact. A benign hemorrhagic cyst is favored, with continued
follow-up by MRI recommended in 6 months to exclude underlying
neoplasm.

No evidence of abdominal metastatic disease.

Colonic diverticulosis.

## 2020-02-22 MED ORDER — GADOBUTROL 1 MMOL/ML IV SOLN
8.0000 mL | Freq: Once | INTRAVENOUS | Status: AC | PRN
  Administered 2020-02-22: 8 mL via INTRAVENOUS

## 2020-02-23 ENCOUNTER — Ambulatory Visit: Payer: Medicare Other | Admitting: Urology

## 2020-03-01 NOTE — Progress Notes (Signed)
° °03/02/2020 °3:04 PM  ° °Ladainian Siebenaler °07/15/1951 °[id number] ° °Referring provider: Cody, Jessica R, MD °[address] °[place],  [place] [address] °Chief Complaint  °Patient presents with  °• renal mass  ° ° °HPI: °Donald Skinner is a 69 y.o. male with a left renal mass returns today for discussion regarding MRI results.  ° °Recently moved to the [place] area [place] to be closer to his daughter. Recently saw Dr. Cody to establish care and prior records were received. ° °Renal ultrasound performed 09/30/2019 which was performed in [place] remarkable for a 6.3 x 6.3 x 6.8 cm left renal mass. Prior MRI from 05/30/2018 showed mass measuring 7.1 x 7.4 cm. Images not available but felt highly suspicious for renal cell carcinoma. 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  ° °Abdominal MRI w/w/o contrast on 02/22/2020 showed 7.8 cm complex hemorrhagic cystic lesion in the lateral midpole of the left kidney.  A benign hemorrhagic cyst is favored. No evidence of abdominal metastatic disease. Colonic diverticulosis. ° ° °PMH: °Past Medical History:  °Diagnosis Date  °• Allergy   °• Diabetes mellitus without complication (HCC)   °• Heart disease   °• Hyperlipidemia   °• Hypertension   °• Myocardial infarction (HCC)   ° ° °Surgical History: °Past Surgical History:  °Procedure Laterality Date  °• Cardiac stents    ° x 6 stents- had 5 heart attacks  ° ° °Home Medications:  °Allergies as of 03/02/2020   °   Reactions  ° Lipitor [atorvastatin]   ° Lisinopril Cough  ° Rosuvastatin Other (See Comments)  ° myalgias  ° Simvastatin Other (See Comments)  ° weakness  °  °  °Medication List  °  °  ° Accurate as of March 02, 2020  3:04 PM. If you have any questions, ask your nurse or doctor.  °  °  °  °amLODipine 5 MG  tablet °Commonly known as: NORVASC °Take 5 mg by mouth daily. °  °ASPIRIN 81 PO °Take by mouth. daily °  °atorvastatin 80 MG tablet °Commonly known as: LIPITOR °Take 80 mg by mouth daily. °  °blood glucose meter kit and supplies °Dispense based on patient and insurance preference. Use up to four times daily as directed. (FOR ICD-10 E10.9, E11.9). °  °carvedilol 25 MG tablet °Commonly known as: COREG °Take 25 mg by mouth 2 (two) times daily with a meal. °  °clopidogrel 75 MG tablet °Commonly known as: PLAVIX °Take 75 mg by mouth daily. °  °metFORMIN 500 MG tablet °Commonly known as: GLUCOPHAGE °Take 1 tablet (500 mg total) by mouth 2 (two) times daily with a meal. °  °nitroGLYCERIN 0.4 MG SL tablet °Commonly known as: NITROSTAT °Place 0.4 mg under the tongue every 5 (five) minutes as needed for chest pain. °  °pioglitazone 45 MG tablet °Commonly known as: ACTOS °TAKE 1 TABLET(45 MG) BY MOUTH DAILY °  °VITAMIN D PO °Take 50 mcg by mouth daily. °  °  ° ° °Allergies:  °Allergies  °Allergen Reactions  °• Lipitor [Atorvastatin]   °• Lisinopril Cough  °• Rosuvastatin Other (See Comments)  °  myalgias  °• Simvastatin Other (See Comments)  °  weakness  ° ° °Family History: °Family History  °Problem Relation Age of   Onset  °• Alcohol abuse Mother   °• Arthritis Mother   °• Alcohol abuse Father   °• Heart attack Father 45  °• Hypertension Father   °• Alcohol abuse Brother   °• Drug abuse Brother   °• Alcohol abuse Brother   °• Mental illness Brother   ° ° °Social History:  reports that he has never smoked. He has never used smokeless tobacco. He reports previous alcohol use. He reports that he does not use drugs. ° ° °Physical Exam: °BP (!) 147/76    Pulse 80    Wt 186 lb (84.4 kg)    BMI 30.95 kg/m²   °Constitutional:  Alert and oriented, No acute distress. °HEENT: [place] AT, moist mucus membranes.  Trachea midline, no masses. °Cardiovascular: No clubbing, cyanosis, or edema. °Respiratory: Normal respiratory effort, no increased  work of breathing. °Skin: No rashes, bruises or suspicious lesions. °Neurologic: Grossly intact, no focal deficits, moving all 4 extremities. °Psychiatric: Normal mood and affect. ° °Laboratory Data: ° °Lab Results  °Component Value Date  ° CREATININE 1.4 (A) 09/29/2019  ° ° °Lab Results  °Component Value Date  ° PSA 0.3 09/29/2019  ° ° °Lab Results  °Component Value Date  ° HGBA1C 8.1 09/29/2019  ° ° ° ° °Pertinent Imaging: °CLINICAL DATA:  Left renal mass. °  °EXAM: °MRI ABDOMEN WITHOUT AND WITH CONTRAST °  °TECHNIQUE: °Multiplanar multisequence MR imaging of the abdomen was performed °both before and after the administration of intravenous contrast. °  °CONTRAST:  8mL GADAVIST GADOBUTROL 1 MMOL/ML IV SOLN °  °COMPARISON:  None. °  °FINDINGS: °Lower chest: No acute findings. °  °Hepatobiliary: No hepatic masses identified. Gallbladder is °unremarkable. No evidence of biliary ductal dilatation. °  °Pancreas:  No mass or inflammatory changes. °  °Spleen:  Within normal limits in size and appearance. °  °Adrenals/Urinary Tract: No adrenal masses are identified. Right °kidney is normal in appearance. A lesion is seen in the lateral °midpole of the left kidney, which measures 7.8 x 7.0 cm on image °68/11. this lesion shows heterogeneous T1 and T2 hyperintensity, °without evidence of internal fat, as well as areas of T2 °hypointensity suggesting hemosiderin. Subtraction imaging shows a °focus of increased intensity in the posterior aspect of this lesion, °however this is favored to be artifact due to heterogeneous nature °of lesion rather than true contrast enhancement. No evidence of °hydronephrosis. °  °Stomach/Bowel: Diverticulosis is seen involving the visualized °portion of the colon. No evidence of diverticulitis in these areas. °  °Vascular/Lymphatic: No pathologically enlarged lymph nodes °identified. No evidence of renal vein or IVC thrombus. No abdominal °aortic aneurysm. °  °Other:  None. °  °Musculoskeletal:   No suspicious bone lesions identified. °  °IMPRESSION: °7.8 cm complex hemorrhagic cystic lesion in the lateral midpole of °the left kidney. No definite contrast enhancement is seen, with °signal intensity on subtraction imaging likely representing °artifact. A benign hemorrhagic cyst is favored, with continued °follow-up by MRI recommended in 6 months to exclude underlying °neoplasm. °  °No evidence of abdominal metastatic disease. °  °Colonic diverticulosis. °  °  °Electronically Signed °  By: John A Stahl M.D. °  On: 02/22/2020 18:48 ° ° °I have personally reviewed the images and agree with radiologist interpretation.  ° ° ° °Assessment & Plan:   ° °1. Left cystic renal mass °A benign hemorrhagic cyst is favored over malignancy based on radiologic features °There has been no marked interval growth since 2019 which is also reassuring in favor of a nonmalignant process °Given patients comorbidities, will continue surveillance and repeat MRI in 6   months for comparison.    °He is agreeable this plan ° °Follow up in 6 months with MRI w/w/o contrast  ° ° ° °[facility] °[address], [address] °[place], [place] [address] °[phone] ° °I, Alexis Patterson, am acting as a scribe for Dr. Ashley Brandon. ° °I have reviewed the above documentation for accuracy and completeness, and I agree with the above.  ° °Ashley Brandon, MD ° ° ° °

## 2020-03-02 ENCOUNTER — Other Ambulatory Visit: Payer: Self-pay

## 2020-03-02 ENCOUNTER — Ambulatory Visit (INDEPENDENT_AMBULATORY_CARE_PROVIDER_SITE_OTHER): Payer: Medicare Other | Admitting: Urology

## 2020-03-02 DIAGNOSIS — N281 Cyst of kidney, acquired: Secondary | ICD-10-CM

## 2020-03-04 ENCOUNTER — Telehealth: Payer: Self-pay

## 2020-03-04 ENCOUNTER — Other Ambulatory Visit: Payer: Self-pay | Admitting: Family Medicine

## 2020-03-04 DIAGNOSIS — E1122 Type 2 diabetes mellitus with diabetic chronic kidney disease: Secondary | ICD-10-CM

## 2020-03-04 DIAGNOSIS — N183 Chronic kidney disease, stage 3 unspecified: Secondary | ICD-10-CM

## 2020-03-04 MED ORDER — BLOOD GLUCOSE METER KIT: PACK | 0 refills | Status: AC

## 2020-03-04 MED ORDER — CARVEDILOL 25 MG PO TABS: 25.0000 mg | ORAL_TABLET | Freq: Two times a day (BID) | ORAL | 0 refills | Status: DC

## 2020-03-04 MED ORDER — CLOPIDOGREL BISULFATE 75 MG PO TABS: 75.0000 mg | ORAL_TABLET | Freq: Every day | ORAL | 0 refills | Status: DC

## 2020-03-04 MED ORDER — AMLODIPINE BESYLATE 5 MG PO TABS: 5.0000 mg | ORAL_TABLET | Freq: Every day | ORAL | 0 refills | Status: DC

## 2020-03-04 NOTE — Telephone Encounter (Addendum)
Refill sent as requested. Please schedule follow up office visit with Dr. Selena Batten.  He was suppose to follow up around 03/08/20.

## 2020-03-04 NOTE — Telephone Encounter (Signed)
Patient came into office today to needing a refill on some of his medications . States that he is currently out of some of the medications and down to the last of some also   Patient is requesting a phone call letting him know that the Rx was sent to the pharmacy   clopidogrel (PLAVIX) 75 MG tablet  carvedilol (COREG) 25 MG tablet  amLODipine (NORVASC) 5 MG tablet   Patient is also needing some diabetic strips and a glucous meter    [id number] - [place], [place] - [address]   Please advise

## 2020-03-07 ENCOUNTER — Other Ambulatory Visit: Payer: Self-pay | Admitting: Family Medicine

## 2020-03-07 NOTE — Telephone Encounter (Signed)
LVM for patient to call and schedule follow up with PCP

## 2020-03-10 NOTE — Telephone Encounter (Signed)
Left message asking pt to call office  °

## 2020-03-10 NOTE — Telephone Encounter (Signed)
[facility] Night - Client Nonclinical Telephone Record AccessNurse Client [facility] Night - Client Client Site [facility] - Night Physician Hannah Beat - MD Contact Type Call Who Is Calling Patient / Member / Family / Caregiver Caller Name Glenwood Revoir Caller Phone Number [phone] Patient Name Donald Skinner Patient DOB 15-Sep-1950 Call Type Message Only Information Provided Reason for Call Request to Schedule Office Appointment Initial Comment Pt is needing to make a follow up appt - missed a call from the office. Disp. Time Disposition Final User 03/09/2020 5:21:03 PM General Information Provided Yes Wisdom, Melynda Call Closed By: Rodena Piety Transaction Date/Time: 03/09/2020 5:18:51 PM (ET)

## 2020-03-11 NOTE — Telephone Encounter (Signed)
Patient scheduled for appointment 8/23.

## 2020-03-21 ENCOUNTER — Encounter: Payer: Self-pay | Admitting: Family Medicine

## 2020-03-21 ENCOUNTER — Other Ambulatory Visit: Payer: Self-pay

## 2020-03-21 ENCOUNTER — Ambulatory Visit (INDEPENDENT_AMBULATORY_CARE_PROVIDER_SITE_OTHER): Payer: Medicare Other | Admitting: Family Medicine

## 2020-03-21 VITALS — BP 122/60 | HR 62 | Temp 97.2°F | Ht 65.0 in | Wt 194.0 lb

## 2020-03-21 DIAGNOSIS — E1122 Type 2 diabetes mellitus with diabetic chronic kidney disease: Secondary | ICD-10-CM | POA: Diagnosis not present

## 2020-03-21 DIAGNOSIS — E1169 Type 2 diabetes mellitus with other specified complication: Secondary | ICD-10-CM | POA: Diagnosis not present

## 2020-03-21 DIAGNOSIS — E785 Hyperlipidemia, unspecified: Secondary | ICD-10-CM

## 2020-03-21 DIAGNOSIS — I10 Essential (primary) hypertension: Secondary | ICD-10-CM

## 2020-03-21 DIAGNOSIS — E1159 Type 2 diabetes mellitus with other circulatory complications: Secondary | ICD-10-CM | POA: Diagnosis not present

## 2020-03-21 DIAGNOSIS — N183 Chronic kidney disease, stage 3 unspecified: Secondary | ICD-10-CM | POA: Diagnosis not present

## 2020-03-21 DIAGNOSIS — I152 Hypertension secondary to endocrine disorders: Secondary | ICD-10-CM

## 2020-03-21 LAB — POCT GLYCOSYLATED HEMOGLOBIN (HGB A1C): Hemoglobin A1C: 6.9 % — AB (ref 4.0–5.6)

## 2020-03-21 NOTE — Assessment & Plan Note (Signed)
BP at goal. Cont coreg and amlodipine - may discuss ARB in the future.

## 2020-03-21 NOTE — Assessment & Plan Note (Signed)
Cont atorvastatin. LDL >70 but will recheck and if still elevated on next check will consider additional agent.  Lab Results  Component Value Date   CHOL 161 09/29/2019   HDL 48 09/29/2019   LDLCALC 85 09/29/2019   TRIG 141 09/29/2019

## 2020-03-21 NOTE — Patient Instructions (Addendum)
Diabetes - continue your medications - try to limit candy to once per week - try to limit orange juice to 1 glass per day - return in 6 months medicare wellness and diabetes check - eye exam before next visit

## 2020-03-21 NOTE — Assessment & Plan Note (Signed)
Good control. Cont medication. Advised reducing candy and OJ consumption as may be able to remove actos if diabetes continued to do well.

## 2020-03-21 NOTE — Progress Notes (Signed)
Subjective:     Donald Skinner is a 69 y.o. male presenting for Follow-up     HPI  #Diabetes Currently taking metformin and pioglitazone  Using medications without difficulties: No Hypoglycemic episodes:No  Hyperglycemic episodes:Yes  Feet problems:some pain with long standing Blood Sugars averaging: does not check regularly  Diet: has a sweet tooth, still eating candy (fun size daily), usually drinks unsweet beverages but drinks OJ  Last HgbA1c:  Lab Results  Component Value Date   HGBA1C 6.9 (A) 03/21/2020    Diabetes Health Maintenance Due:    Diabetes Health Maintenance Due  Topic Date Due  . OPHTHALMOLOGY EXAM  Never done  . URINE MICROALBUMIN  Never done  . HEMOGLOBIN A1C  09/21/2020  . FOOT EXAM  03/21/2021   Denies weight gain, breathing difficulty, swelling   Renal mass - seeing urology - cyst on recheck     Review of Systems   Social History   Tobacco Use  Smoking Status Never Smoker  Smokeless Tobacco Never Used        Objective:    BP Readings from Last 3 Encounters:  03/21/20 122/60  03/02/20 (!) 147/76  01/15/20 135/76   Wt Readings from Last 3 Encounters:  03/21/20 194 lb (88 kg)  03/02/20 186 lb (84.4 kg)  01/15/20 186 lb (84.4 kg)    BP 122/60   Pulse 62   Temp (!) 97.2 F (36.2 C) (Temporal)   Ht 5\' 5"  (1.651 m)   Wt 194 lb (88 kg)   SpO2 96%   BMI 32.28 kg/m    Physical Exam Constitutional:      Appearance: Normal appearance. He is not ill-appearing or diaphoretic.  HENT:     Right Ear: External ear normal.     Left Ear: External ear normal.  Eyes:     General: No scleral icterus.    Extraocular Movements: Extraocular movements intact.     Conjunctiva/sclera: Conjunctivae normal.  Cardiovascular:     Rate and Rhythm: Normal rate and regular rhythm.     Heart sounds: No murmur heard.   Pulmonary:     Effort: Pulmonary effort is normal. No respiratory distress.     Breath sounds: Normal breath sounds. No  wheezing.  Musculoskeletal:     Cervical back: Neck supple.  Skin:    General: Skin is warm and dry.  Neurological:     Mental Status: He is alert. Mental status is at baseline.  Psychiatric:        Mood and Affect: Mood normal.     Diabetic Foot Exam - Simple   Simple Foot Form Diabetic Foot exam was performed with the following findings: Yes 03/21/2020  2:23 PM  Visual Inspection See comments: Yes Sensation Testing Intact to touch and monofilament testing bilaterally: Yes Pulse Check Posterior Tibialis and Dorsalis pulse intact bilaterally: Yes Comments 5th digit bilaterally with some skin breakdown first layer on the side that rubs against 4th digit          Assessment & Plan:   Problem List Items Addressed This Visit      Cardiovascular and Mediastinum   Hypertension associated with diabetes (HCC)    BP at goal. Cont coreg and amlodipine - may discuss ARB in the future.         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 Cont medication. Advised reducing candy and OJ consumption as may be able  to remove actos if diabetes continued to do well.       Relevant Orders   POCT glycosylated hemoglobin (Hb A1C) (Completed)   Microalbumin / creatinine urine ratio   Hyperlipidemia associated with type 2 diabetes mellitus (HCC)    Cont atorvastatin. LDL >70 but will recheck and if still elevated on next check will consider additional agent.  Lab Results  Component Value Date   CHOL 161 09/29/2019   HDL 48 09/29/2019   LDLCALC 85 09/29/2019   TRIG 141 09/29/2019             Return in about 6 months (around 09/21/2020).  Lynnda Child, MD  This visit occurred during the SARS-CoV-2 public health emergency.  Safety protocols were in place, including screening questions prior to the visit, additional usage of staff PPE, and extensive cleaning of exam room while observing appropriate contact time as  indicated for disinfecting solutions.

## 2020-03-22 LAB — MICROALBUMIN / CREATININE URINE RATIO
Creatinine,U: 357.1 mg/dL
Microalb Creat Ratio: 14.8 mg/g (ref 0.0–30.0)
Microalb, Ur: 52.9 mg/dL — ABNORMAL HIGH (ref 0.0–1.9)

## 2020-05-20 ENCOUNTER — Telehealth: Payer: Self-pay | Admitting: Family Medicine

## 2020-05-20 DIAGNOSIS — I739 Peripheral vascular disease, unspecified: Secondary | ICD-10-CM | POA: Insufficient documentation

## 2020-05-20 NOTE — Telephone Encounter (Signed)
Monica  NP with [facility] house calls  Wanting report PAD screening Right foot 0.40 significant Left foot 0.92 mild  They do rounds once year.  Insurance will send paperwork with results within 2 -4 weeks.   They will not call pt back.  if you have any questions please contact

## 2020-05-20 NOTE — Telephone Encounter (Signed)
Noted, already on ASA and statin

## 2020-06-15 ENCOUNTER — Ambulatory Visit: Payer: Medicare Other | Admitting: Internal Medicine

## 2020-08-18 ENCOUNTER — Ambulatory Visit: Payer: Medicare Other | Admitting: Internal Medicine

## 2020-08-18 NOTE — Progress Notes (Deleted)
Follow-up Outpatient Visit Date: 08/18/2020  Primary Care Provider: Lesleigh Noe, [place] [address]  Chief Complaint: ***  HPI:  Donald Skinner is a 70 y.o. male with history of prior myocardial infarction and multiple PCI's, ischemic cardiomyopathy with LVEF of 35 to 40% by echo in 07/2016, hypertension, diabetes mellitus, and left renal mass favored to be benign hemorrhagic cyst, who presents for follow-up of coronary artery disease.  I met Mr. Carbajal in May, at which time he wished to establish cardiology care after having moved to the [place] area.  At that time, he was doing well other than occasional orthostatic lightheadedness without syncope or falls.  We agreed to defer medication changes and additional testing.  --------------------------------------------------------------------------------------------------  Cardiovascular History & Procedures: Cardiovascular Problems:  Coronary artery disease with prior MI  Risk Factors:  Known CAD, hypertension, diabetes mellitus, male gender, obesity, and age > 72  Cath/PCI:  PCI to mid LAD in 08/2001  PCI to proximal LAD and mid LCx in 06/2005  PCI to proximal LAD for ISR in 02/2010 (patient declined single-vessel CABG at that time  PCI to proximal LCx in setting of NSTEMI in 08/2012  PCI to proximal RCA in the setting of STEMI in 12/2012  LHC in 07/2016 demonstrating normal LMCA, diffuse 30% mid LAD stenosis and diffuse distal LAD disease of 70 to 80%, D1 with proximal diffuse 95% stenosis, ramus intermedius with ostial and proximal 90% stenosis and diffuse disease beyond, nondominant LCx with patent proximal and mid vessel stents.  Severe diffuse 99% stenosis in the distal vessel.  RCA with 100% proximal occlusion with left-to-right collaterals.  CV Surgery:  None  EP Procedures and Devices:  None  Non-Invasive Evaluation(s):  TTE (08/28/2016): LVEF 35-40% with mild global hypokinesis and more  severe hypokinesis of the inferior, inferoseptal, and mid anterior walls.  Mild mitral annular calcification and moderate mitral regurgitation.  Normal diastolic function.  Normal PA pressure.   Recent CV Pertinent Labs: Lab Results  Component Value Date   CHOL 161 09/29/2019   HDL 48 09/29/2019   LDLCALC 85 09/29/2019   TRIG 141 09/29/2019   K 4.1 09/29/2019   BUN 23 (A) 09/29/2019   CREATININE 1.4 (A) 09/29/2019    Past medical and surgical history were reviewed and updated in EPIC.  No outpatient medications have been marked as taking for the 08/18/20 encounter (Appointment) with End, Harrell Gave, MD.    Allergies: Lipitor [atorvastatin], Lisinopril, Rosuvastatin, and Simvastatin  Social History   Tobacco Use  . Smoking status: Never Smoker  . Smokeless tobacco: Never Used  Vaping Use  . Vaping Use: Never used  Substance Use Topics  . Alcohol use: Not Currently  . Drug use: Never    Family History  Problem Relation Age of Onset  . Alcohol abuse Mother   . Arthritis Mother   . Alcohol abuse Father   . Heart attack Father 57  . Hypertension Father   . Alcohol abuse Brother   . Drug abuse Brother   . Alcohol abuse Brother   . Mental illness Brother     Review of Systems: A 12-system review of systems was performed and was negative except as noted in the HPI.  --------------------------------------------------------------------------------------------------  Physical Exam: There were no vitals taken for this visit.  General:  NAD. Neck: No JVD or HJR. Lungs: Clear to auscultation bilaterally without wheezes or crackles. Heart: Regular rate and rhythm without murmurs, rubs, or gallops. Abdomen: Soft,  nontender, nondistended. Extremities: No lower extremity edema.  EKG:  ***  Lab Results  Component Value Date   WBC 5.0 10/09/2019   HGB 13.0 (A) 10/09/2019   HCT 40 (A) 10/09/2019   PLT 158 10/09/2019    Lab Results  Component Value Date   NA 136  (A) 09/29/2019   K 4.1 09/29/2019   CL 102 09/29/2019   CO2 24 (A) 09/29/2019   BUN 23 (A) 09/29/2019   CREATININE 1.4 (A) 09/29/2019   ALT 11 09/29/2019    Lab Results  Component Value Date   CHOL 161 09/29/2019   HDL 48 09/29/2019   LDLCALC 85 09/29/2019   TRIG 141 09/29/2019    --------------------------------------------------------------------------------------------------  ASSESSMENT AND PLAN: Harrell Gave Sintia Mckissic, MD 08/18/2020 7:35 AM

## 2020-08-19 ENCOUNTER — Encounter: Payer: Self-pay | Admitting: Internal Medicine

## 2020-08-23 ENCOUNTER — Ambulatory Visit (INDEPENDENT_AMBULATORY_CARE_PROVIDER_SITE_OTHER): Payer: Medicare Other | Admitting: Family Medicine

## 2020-08-23 ENCOUNTER — Encounter: Payer: Self-pay | Admitting: Family Medicine

## 2020-08-23 ENCOUNTER — Ambulatory Visit (INDEPENDENT_AMBULATORY_CARE_PROVIDER_SITE_OTHER)
Admission: RE | Admit: 2020-08-23 | Discharge: 2020-08-23 | Disposition: A | Discharge status: Home / Self Care | Payer: Medicare Other | Source: Ambulatory Visit | Attending: Family Medicine | Admitting: Family Medicine

## 2020-08-23 VITALS — BP 170/80 | HR 63 | Temp 98.2°F | Ht 65.0 in | Wt 191.0 lb

## 2020-08-23 DIAGNOSIS — M792 Neuralgia and neuritis, unspecified: Secondary | ICD-10-CM

## 2020-08-23 IMAGING — DX DG CERVICAL SPINE COMPLETE 4+V
6 series · 6 of 6 positions shown · non-contrast
Comparison: No prior.

CLINICAL DATA: Right arm pain.

EXAM:
CERVICAL SPINE - COMPLETE 4+ VIEW

[c-spine lat]
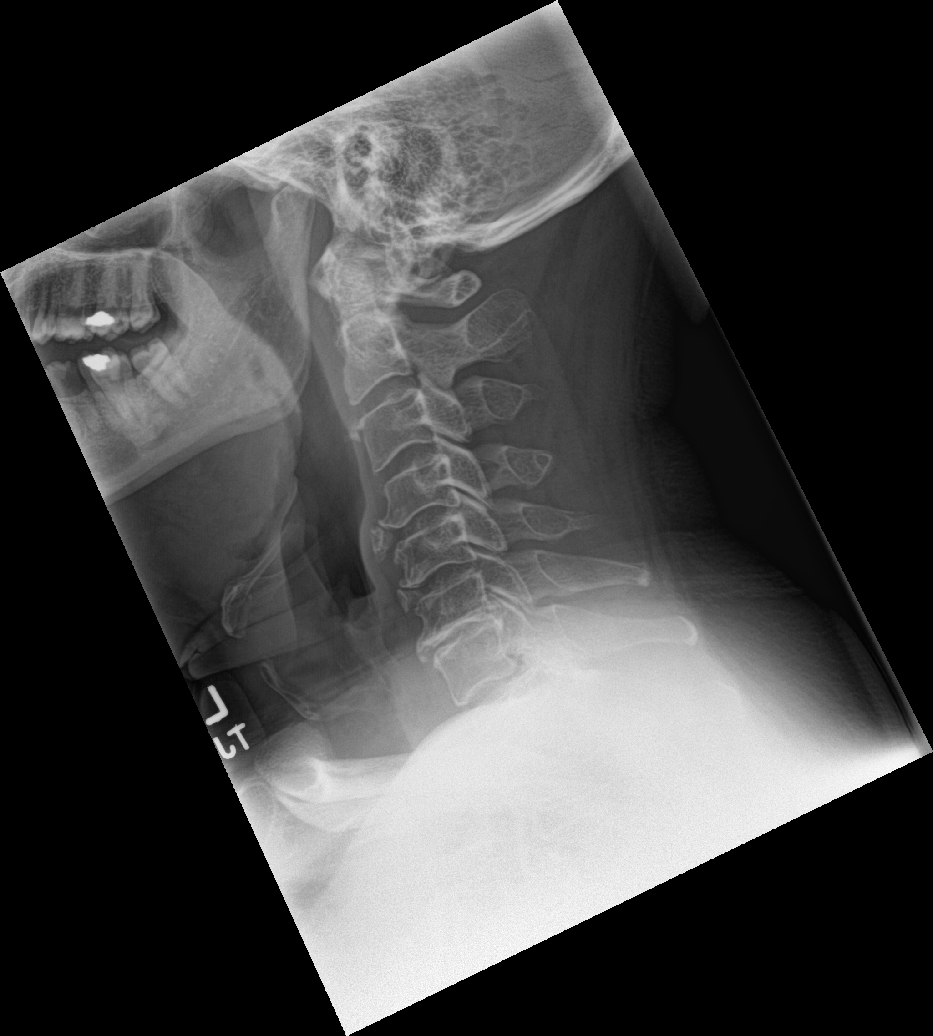

[c-spine obl (1 of 2)]
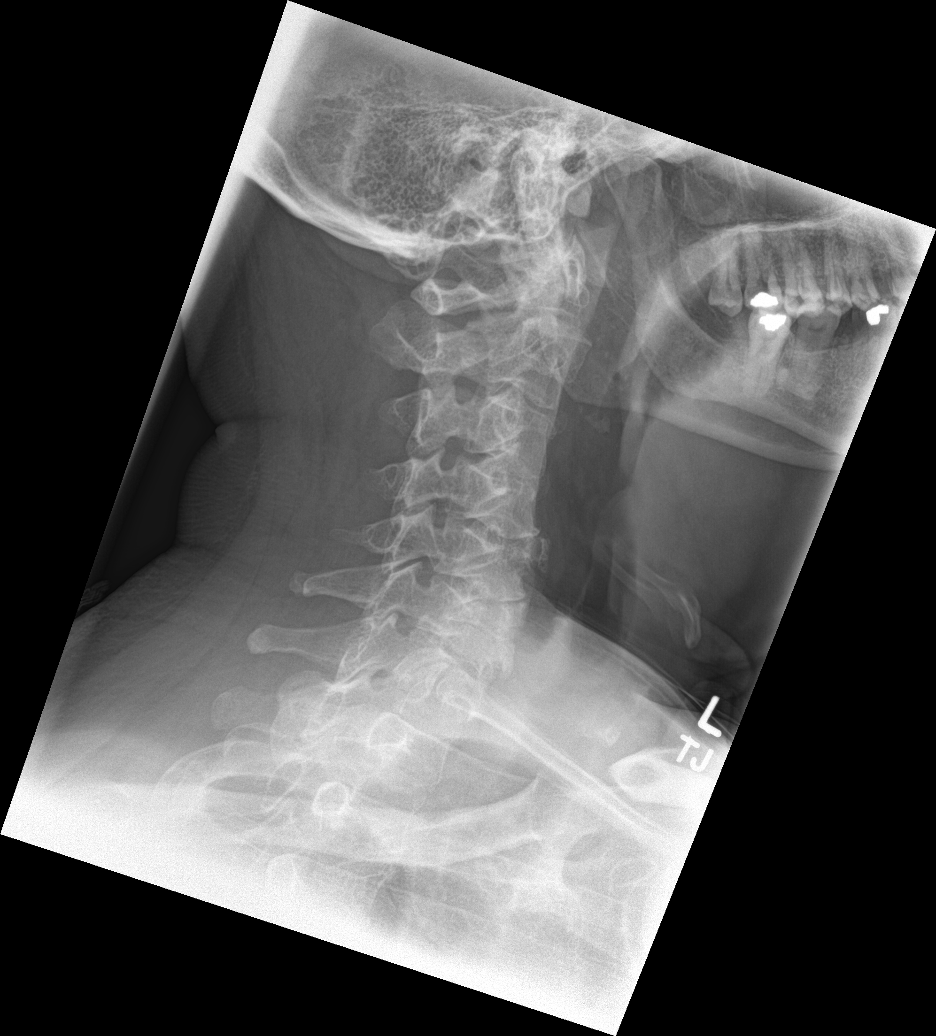

[c-spine obl (2 of 2)]
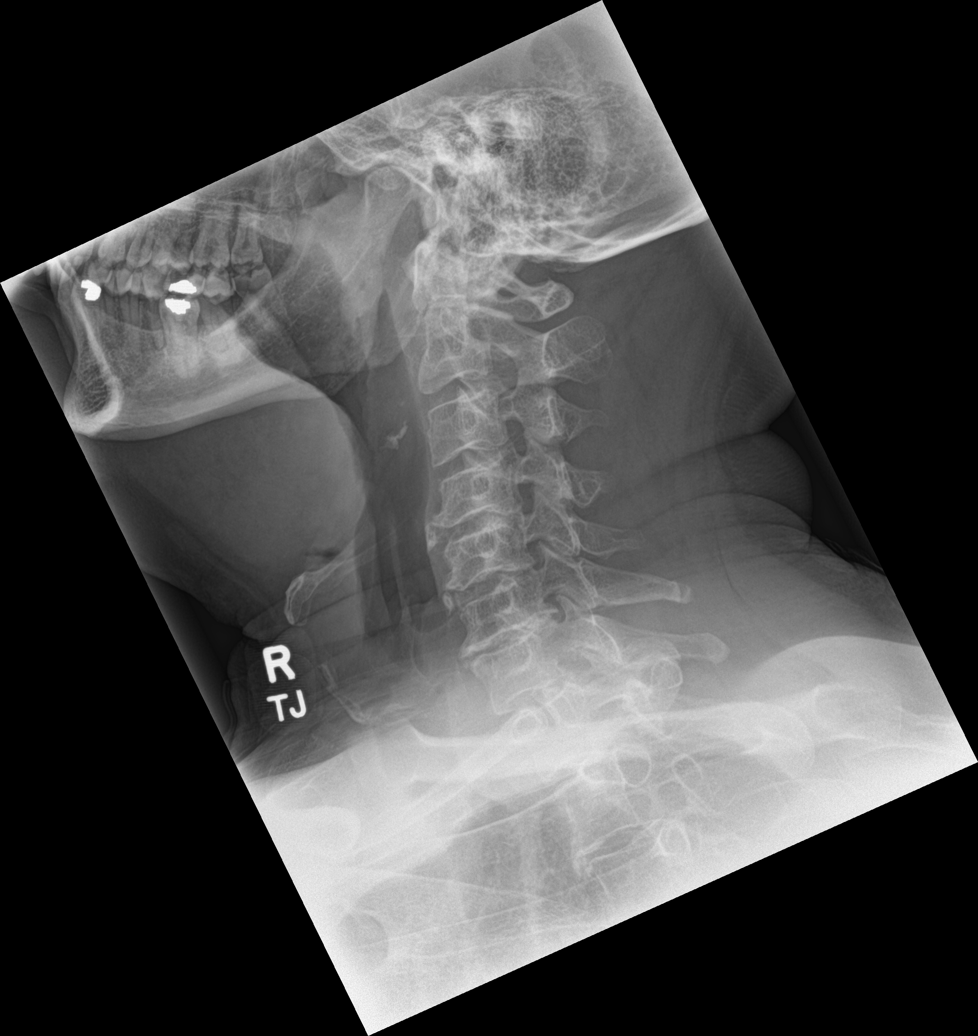

[c-spine ap]
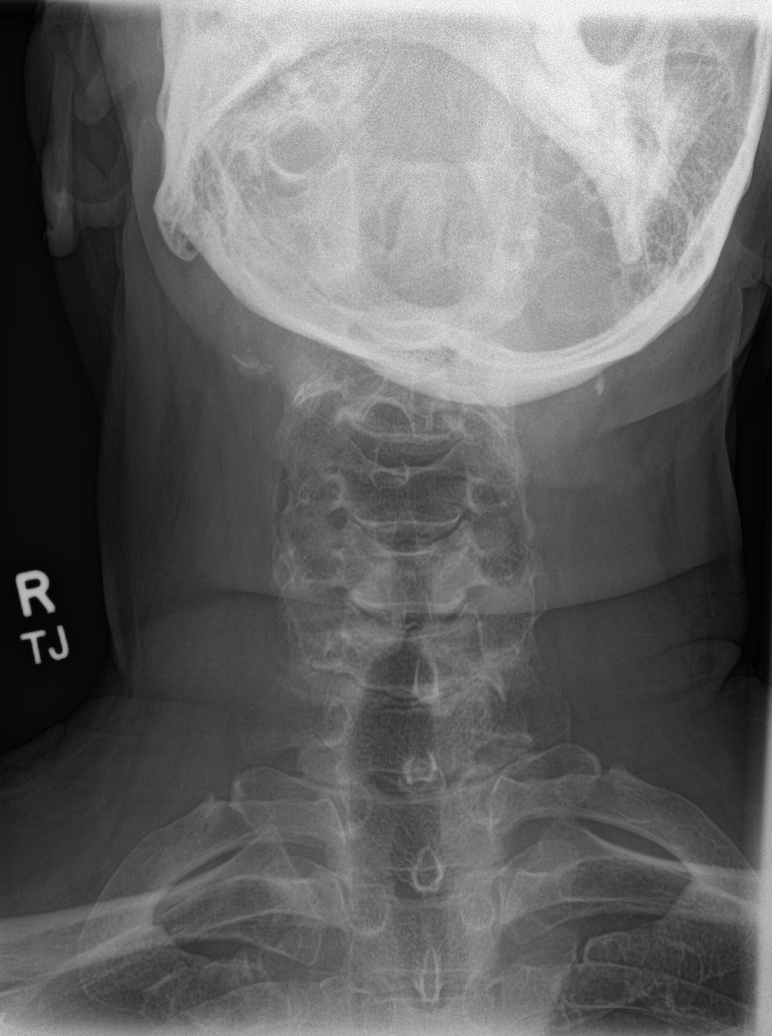

[c-spine open mouth]
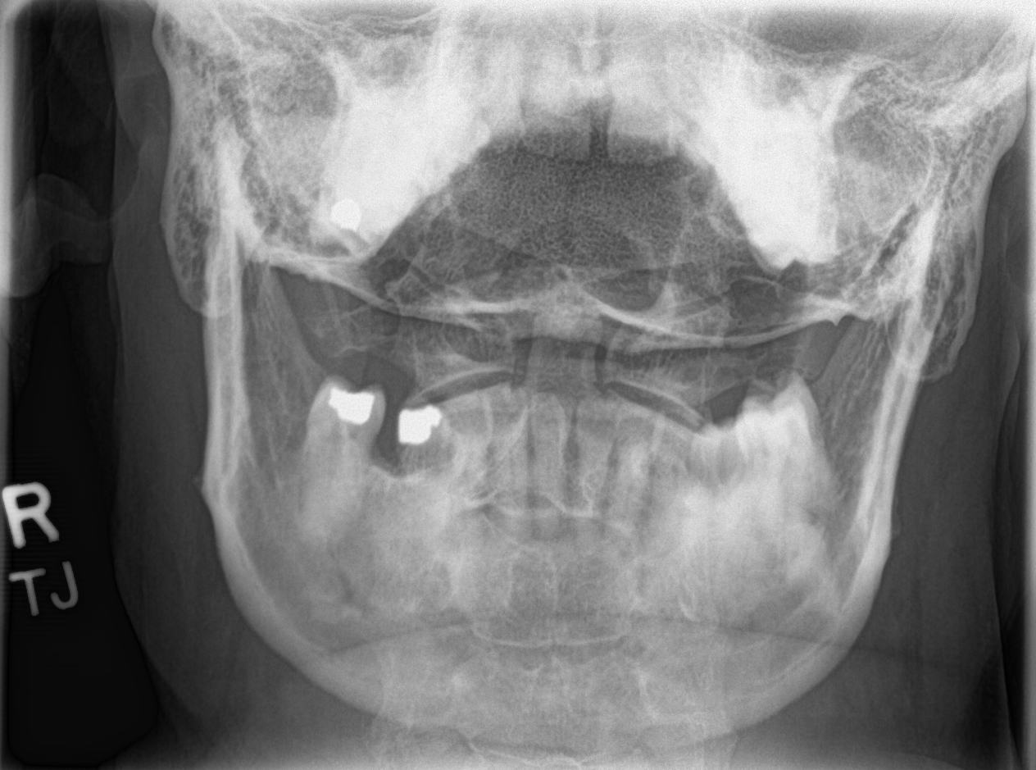

[c-spine swimmers]
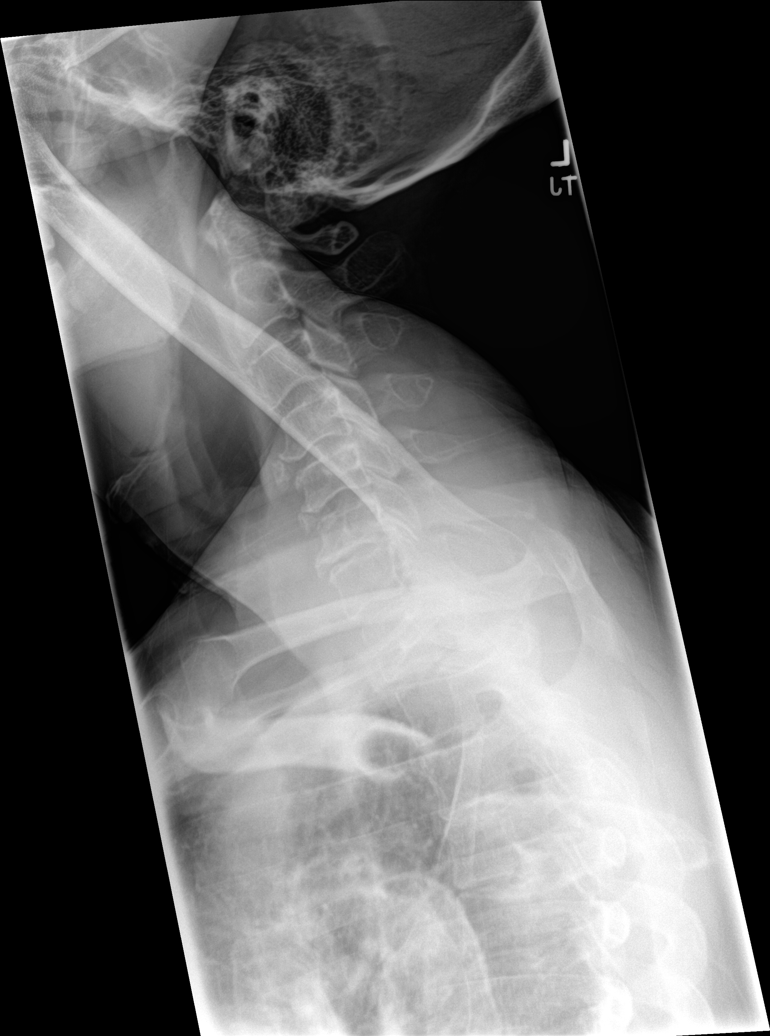

[6 of 6 positions shown; findings below may reference images not displayed]

FINDINGS: Straightening of cervical spine. Diffuse osteopenia and multilevel
degenerative change. Degenerative endplate osteophyte formation most
prominent at C4-C5, C5-C6, C6-C7. Disc space loss most prominent
C6-C7. Mild multilevel bilateral neural foraminal narrowing. No
acute bony abnormality identified. No evidence of fracture or
dislocation. Carotid vascular calcification. Pulmonary apices are
clear.
IMPRESSION: Number diffuse osteopenia and multilevel degenerative change.
Degenerative endplate osteophyte formation most prominent at C4-C5,
C5-C6, C6-C7. Disc space loss most prominent C6-C7. Mild multilevel
bilateral neural foraminal narrowing. No acute bony abnormality
identified.

2.  Carotid vascular disease.

## 2020-08-23 MED ORDER — GABAPENTIN 100 MG PO CAPS: 100.0000 mg | ORAL_CAPSULE | Freq: Three times a day (TID) | ORAL | 1 refills | Status: DC | PRN

## 2020-08-23 NOTE — Progress Notes (Signed)
This visit occurred during the SARS-CoV-2 public health emergency.  Safety protocols were in place, including screening questions prior to the visit, additional usage of staff PPE, and extensive cleaning of exam room while observing appropriate contact time as indicated for disinfecting solutions.  BP elevation attributed to pain, d/w pt. see after visit summary.  No L sided sx.  R neck and arm pain, from the neck down to the R thumb.  More pain last night.  Started about 2 months ago, intermittent.  Tried to stretch.  Grip wnl.  Normal sensation.  No leg sx.    Not checking sugar frequently but last A1c 6.9.    No FCNAVD.  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    Meds, vitals, and allergies reviewed.   ROS: Per HPI unless specifically indicated in ROS section   nad ncat Neck supple, no LA Normal S/S x4 except for dermatomal paresthesia on the R forearm.   Pain in R arm with neck ext and rotation to right.  Neck is not stiff. Normal grip B.   rrr Ctab.   No rash.   Skin well perfused. Gait normal.

## 2020-08-23 NOTE — Patient Instructions (Signed)
Check your sugar and BP at home.  If your BP is consistently above 140/90, then let us know.   Go to the lab on the way out.   If you have mychart we'll likely use that to update you.    Take care.  Glad to see you. Start taking gabapentin 100mg  a day.  Gradually increase the dose if needed for pain.

## 2020-08-24 DIAGNOSIS — M792 Neuralgia and neuritis, unspecified: Secondary | ICD-10-CM | POA: Insufficient documentation

## 2020-08-24 NOTE — Assessment & Plan Note (Signed)
Presumed to be originating in the neck, reasonable to check plain films.  Anatomy discussed with patient.  Can try gabapentin with routine cautions and work note given.  He agrees with plan.  See notes on imaging.

## 2020-08-26 ENCOUNTER — Telehealth: Payer: Self-pay | Admitting: Family Medicine

## 2020-08-26 NOTE — Telephone Encounter (Signed)
LMTCB

## 2020-08-26 NOTE — Telephone Encounter (Signed)
Pt called wanted to know about x-ray results

## 2020-08-27 ENCOUNTER — Encounter: Payer: Self-pay | Admitting: Family Medicine

## 2020-08-27 DIAGNOSIS — I6529 Occlusion and stenosis of unspecified carotid artery: Secondary | ICD-10-CM | POA: Insufficient documentation

## 2020-08-30 ENCOUNTER — Other Ambulatory Visit: Payer: Self-pay | Admitting: Family Medicine

## 2020-08-30 NOTE — Telephone Encounter (Signed)
Pharmacy requests refill on: Clopidogrel 75 mg   LAST REFILL: 03/04/2020 (Q-30, R-0) LAST OV: 08/23/2020 NEXT OV: 09/22/2020 PHARMACY: [id number] [address], [place]

## 2020-08-30 NOTE — Telephone Encounter (Signed)
lmtcb

## 2020-08-30 NOTE — Telephone Encounter (Signed)
Patient left a voicemail requesting a call back about his test results. 

## 2020-08-30 NOTE — Telephone Encounter (Signed)
LMTCB

## 2020-09-05 ENCOUNTER — Telehealth: Payer: Self-pay | Admitting: *Deleted

## 2020-09-05 NOTE — Telephone Encounter (Signed)
LMTCB for results. I have also sent patient a letter/results via mail.

## 2020-09-05 NOTE — Telephone Encounter (Signed)
Left VM to r/s follow up appointment till MRI is complete.

## 2020-09-07 ENCOUNTER — Ambulatory Visit: Payer: Medicare Other | Admitting: Urology

## 2020-09-13 ENCOUNTER — Other Ambulatory Visit: Payer: Self-pay

## 2020-09-13 ENCOUNTER — Ambulatory Visit (INDEPENDENT_AMBULATORY_CARE_PROVIDER_SITE_OTHER): Payer: Medicare Other | Admitting: Family Medicine

## 2020-09-13 ENCOUNTER — Encounter: Payer: Self-pay | Admitting: Family Medicine

## 2020-09-13 ENCOUNTER — Other Ambulatory Visit: Payer: Self-pay | Admitting: Family Medicine

## 2020-09-13 VITALS — BP 162/94 | HR 77 | Temp 97.3°F | Resp 20 | Wt 196.2 lb

## 2020-09-13 DIAGNOSIS — I152 Hypertension secondary to endocrine disorders: Secondary | ICD-10-CM

## 2020-09-13 DIAGNOSIS — J4520 Mild intermittent asthma, uncomplicated: Secondary | ICD-10-CM | POA: Diagnosis not present

## 2020-09-13 DIAGNOSIS — E785 Hyperlipidemia, unspecified: Secondary | ICD-10-CM | POA: Diagnosis not present

## 2020-09-13 DIAGNOSIS — E1159 Type 2 diabetes mellitus with other circulatory complications: Secondary | ICD-10-CM

## 2020-09-13 DIAGNOSIS — E1122 Type 2 diabetes mellitus with diabetic chronic kidney disease: Secondary | ICD-10-CM

## 2020-09-13 DIAGNOSIS — I6529 Occlusion and stenosis of unspecified carotid artery: Secondary | ICD-10-CM

## 2020-09-13 DIAGNOSIS — N183 Chronic kidney disease, stage 3 unspecified: Secondary | ICD-10-CM | POA: Diagnosis not present

## 2020-09-13 DIAGNOSIS — D649 Anemia, unspecified: Secondary | ICD-10-CM

## 2020-09-13 DIAGNOSIS — E1169 Type 2 diabetes mellitus with other specified complication: Secondary | ICD-10-CM

## 2020-09-13 DIAGNOSIS — I252 Old myocardial infarction: Secondary | ICD-10-CM

## 2020-09-13 LAB — CBC
HCT: 42.7 % (ref 39.0–52.0)
Hemoglobin: 13.9 g/dL (ref 13.0–17.0)
MCHC: 32.6 g/dL (ref 30.0–36.0)
MCV: 89.3 fl (ref 78.0–100.0)
Platelets: 135 10*3/uL — ABNORMAL LOW (ref 150.0–400.0)
RBC: 4.78 Mil/uL (ref 4.22–5.81)
RDW: 15.9 % — ABNORMAL HIGH (ref 11.5–15.5)
WBC: 5.8 10*3/uL (ref 4.0–10.5)

## 2020-09-13 LAB — LIPID PANEL
Cholesterol: 129 mg/dL (ref 0–200)
HDL: 53.8 mg/dL (ref >39.00)
LDL Cholesterol: 63 mg/dL (ref 0–99)
NonHDL: 74.98
Total CHOL/HDL Ratio: 2
Triglycerides: 62 mg/dL (ref 0.0–149.0)
VLDL: 12.4 mg/dL (ref 0.0–40.0)

## 2020-09-13 LAB — COMPREHENSIVE METABOLIC PANEL
ALT: 18 U/L (ref 0–53)
AST: 15 U/L (ref 0–37)
Albumin: 4 g/dL (ref 3.5–5.2)
Alkaline Phosphatase: 63 U/L (ref 39–117)
BUN: 22 mg/dL (ref 6–23)
CO2: 28 mEq/L (ref 19–32)
Calcium: 9.6 mg/dL (ref 8.4–10.5)
Chloride: 105 mEq/L (ref 96–112)
Creatinine, Ser: 1.54 mg/dL — ABNORMAL HIGH (ref 0.40–1.50)
GFR: 44.95 mL/min — ABNORMAL LOW (ref >60.00)
Glucose, Bld: 136 mg/dL — ABNORMAL HIGH (ref 70–99)
Potassium: 4.3 mEq/L (ref 3.5–5.1)
Sodium: 140 mEq/L (ref 135–145)
Total Bilirubin: 0.8 mg/dL (ref 0.2–1.2)
Total Protein: 7.4 g/dL (ref 6.0–8.3)

## 2020-09-13 LAB — HEMOGLOBIN A1C: Hgb A1c MFr Bld: 8.1 % — ABNORMAL HIGH (ref 4.6–6.5)

## 2020-09-13 MED ORDER — AMLODIPINE BESYLATE 5 MG PO TABS: 5.0000 mg | ORAL_TABLET | Freq: Every day | ORAL | 0 refills | Status: DC

## 2020-09-13 MED ORDER — ALBUTEROL SULFATE HFA 108 (90 BASE) MCG/ACT IN AERS: 2.0000 | INHALATION_SPRAY | Freq: Four times a day (QID) | RESPIRATORY_TRACT | 2 refills | Status: DC | PRN

## 2020-09-13 MED ORDER — CLOPIDOGREL BISULFATE 75 MG PO TABS: ORAL_TABLET | ORAL | 0 refills | Status: DC

## 2020-09-13 NOTE — Assessment & Plan Note (Signed)
Pt notes hx of asthma. Asthma attack on recent cruise. Advised daily allergy pill. Albuterol rescue inhaler. If persisting will likely get pulm referral.

## 2020-09-13 NOTE — Assessment & Plan Note (Signed)
Cont asa and atorvastatin 80 mg.

## 2020-09-13 NOTE — Progress Notes (Signed)
Subjective:     Donald Skinner is a 70 y.o. male presenting for Shortness of Breath (During cruise last week. Had difficulty breathing, coughing.)     HPI  #SOB - 45 minute episode of SOB and coughing - saw the cruise ship doctor - was given pills and breathing treatment - the cruise doctor thought it was an asthma attack - negative for covid - cxr was normal - received nebulizer with improvement - diagnosed with asthma in the past - was given a pills for allergies symptoms  - endorses allergy symptoms  #HTN - ran out of amlodipine and plavix - tried to refill these but told to contact doctor  #DM - taking medication - 120-150 - was on metformin 1000 mg bid - was decreased due to improvement  Review of Systems   Social History   Tobacco Use  Smoking Status Never Smoker  Smokeless Tobacco Never Used        Objective:    BP Readings from Last 3 Encounters:  09/13/20 (!) 162/94  08/23/20 (!) 170/80  03/21/20 122/60   Wt Readings from Last 3 Encounters:  09/13/20 196 lb 4 oz (89 kg)  08/23/20 191 lb (86.6 kg)  03/21/20 194 lb (88 kg)    BP (!) 162/94   Pulse 77   Temp (!) 97.3 F (36.3 C)   Resp 20   Wt 196 lb 4 oz (89 kg)   SpO2 97%   BMI 32.66 kg/m    Physical Exam Constitutional:      Appearance: Normal appearance. He is not ill-appearing or diaphoretic.  HENT:     Right Ear: External ear normal.     Left Ear: External ear normal.     Nose: Nose normal.  Eyes:     General: No scleral icterus.    Extraocular Movements: Extraocular movements intact.     Conjunctiva/sclera: Conjunctivae normal.  Cardiovascular:     Rate and Rhythm: Normal rate and regular rhythm.     Heart sounds: Murmur heard.    Pulmonary:     Effort: Pulmonary effort is normal. No tachypnea or respiratory distress.     Breath sounds: Normal breath sounds. No wheezing.  Musculoskeletal:     Cervical back: Neck supple.  Skin:    General: Skin is warm and dry.   Neurological:     Mental Status: He is alert. Mental status is at baseline.  Psychiatric:        Mood and Affect: Mood normal.           Assessment & Plan:   Problem List Items Addressed This Visit      Cardiovascular and Mediastinum   Hypertension associated with diabetes (HCC)    BP elevated. Cont carvedilol 25 mg. Restart amlodipine 5 mg. Return in 1 month for bp check      Relevant Medications   amLODipine (NORVASC) 5 MG tablet   Other Relevant Orders   Comprehensive metabolic panel (Completed)   CBC (Completed)   Carotid atherosclerosis    Cont asa and atorvastatin 80 mg.       Relevant Medications   amLODipine (NORVASC) 5 MG tablet   clopidogrel (PLAVIX) 75 MG tablet     Respiratory   Mild intermittent asthma without complication    Pt notes hx of asthma. Asthma attack on recent cruise. Advised daily allergy pill. Albuterol rescue inhaler. If persisting will likely get pulm referral.       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 Will increase metformin back to 1000 mg BID. Cont lipitor 80 mg      Relevant Orders   Hemoglobin A1c (Completed)   Hyperlipidemia associated with type 2 diabetes mellitus (HCC)    Lab Results  Component Value Date   LDLCALC 63 09/13/2020  at goal. Cont atorvastatin       Relevant Orders   Lipid panel (Completed)     Other   History of MI (myocardial infarction)    Encouraged cardiology f/u. Number provided. Cont plavix, asa, and atorvastatin      Relevant Medications   clopidogrel (PLAVIX) 75 MG tablet   Anemia   Relevant Orders   CBC (Completed)       Return in about 4 weeks (around 10/11/2020).  Lynnda Child, MD  This visit occurred during the SARS-CoV-2 public health emergency.  Safety protocols were in place, including screening questions  prior to the visit, additional usage of staff PPE, and extensive cleaning of exam room while observing appropriate contact time as indicated for disinfecting solutions.

## 2020-09-13 NOTE — Assessment & Plan Note (Addendum)
Continues to take actos 45 mg. Will increase metformin back to 1000 mg BID. Cont lipitor 80 mg

## 2020-09-13 NOTE — Patient Instructions (Addendum)
Asthma - inhaler Albuterol - rescue - use this if you get an asthma attack - start Daily Claritin, zyrtec, allegra (store brand OK)   #Blood pressure - continue carvidolol  - restart amlodipine  Call Dr. Okey Dupre to schedule follow-up visit [phone]

## 2020-09-13 NOTE — Assessment & Plan Note (Signed)
Lab Results  Component Value Date   LDLCALC 63 09/13/2020  at goal. Cont atorvastatin

## 2020-09-13 NOTE — Assessment & Plan Note (Signed)
Encouraged cardiology f/u. Number provided. Cont plavix, asa, and atorvastatin

## 2020-09-13 NOTE — Assessment & Plan Note (Signed)
BP elevated. Cont carvedilol 25 mg. Restart amlodipine 5 mg. Return in 1 month for bp check

## 2020-09-22 ENCOUNTER — Ambulatory Visit: Payer: Medicare Other | Admitting: Family Medicine

## 2020-09-23 ENCOUNTER — Ambulatory Visit
Admission: RE | Admit: 2020-09-23 | Discharge: 2020-09-23 | Disposition: A | Discharge status: Home / Self Care | Payer: Medicare Other | Source: Ambulatory Visit | Attending: Urology | Admitting: Urology

## 2020-09-23 ENCOUNTER — Other Ambulatory Visit: Payer: Self-pay

## 2020-09-23 DIAGNOSIS — N281 Cyst of kidney, acquired: Secondary | ICD-10-CM | POA: Insufficient documentation

## 2020-09-23 IMAGING — MR MR ABDOMEN WO/W CM
17 series · 48 of 48 positions shown · IV contrast (gadavist)
Comparison: MRI abdomen [DATE]

CLINICAL DATA: Follow-up large left renal lesion.

EXAM:
MRI ABDOMEN WITHOUT AND WITH CONTRAST
TECHNIQUE: Multiplanar multisequence MR imaging of the abdomen was performed
both before and after the administration of intravenous contrast.
CONTRAST:  8mL GADAVIST GADOBUTROL 1 MMOL/ML IV SOLN

[Series 3: cor haste · coronal · 6.0mm · 1.19mm/px · 2 of 32 slices shown]
[im 1/32]
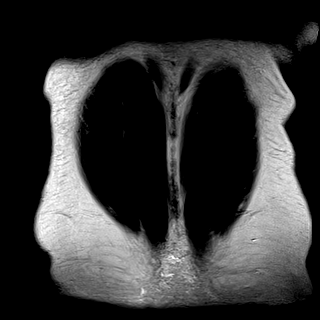
[im 32/32]
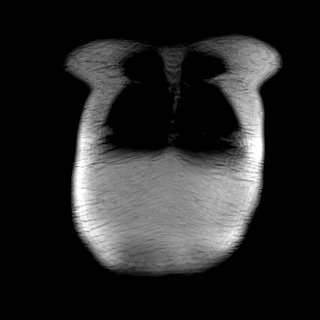

[Series 6: T2 fat-sat · axial · 6.0mm · 1.19mm/px · z∈[-81,+142]mm · 2 of 32 slices shown]
[im 1/32]
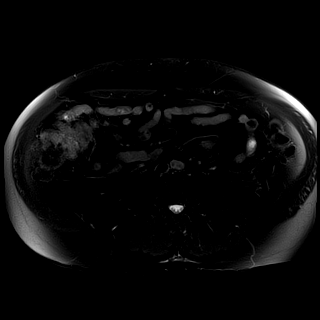
[im 32/32]
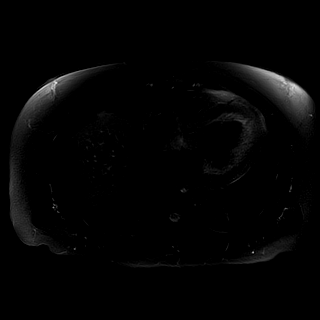

[Series 7: DWI · axial · 6.0mm · 1.42mm/px · z∈[-81,+142]mm · 5 of 96 slices shown (1 of 2)]
[im 1/96]
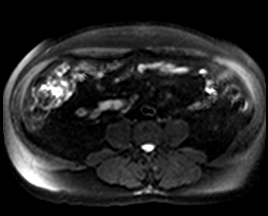
[im 24/96]
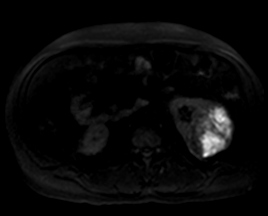
[im 48/96]
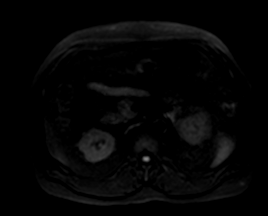
[im 72/96]
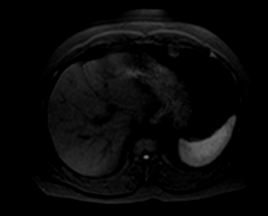
[im 96/96]
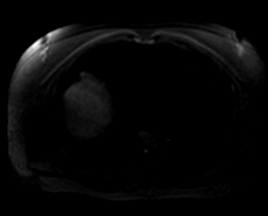

[Series 8: DWI · axial · 6.0mm · 1.42mm/px · 1 of 32 slices shown (2 of 2)]
[im 1/32]
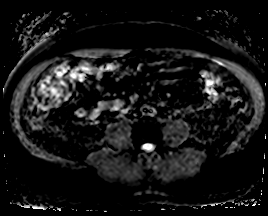

[Series 9: ax in & · axial · 3.0mm · 1.19mm/px · z∈[-76,+137]mm · 6 of 144 slices shown]
[im 1/144]
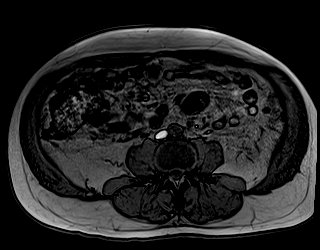
[im 29/144]
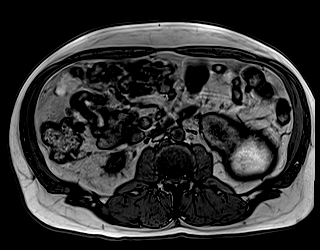
[im 58/144]
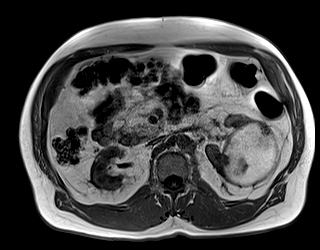
[im 86/144]
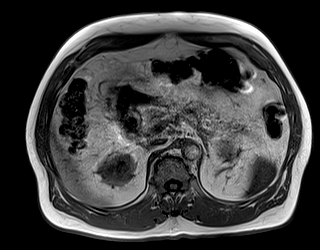
[im 115/144]
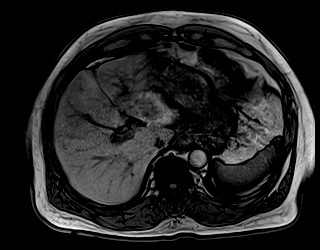
[im 144/144]
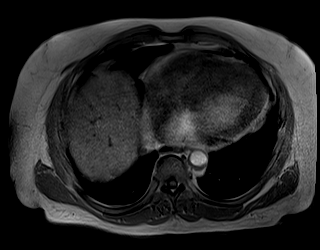

[Series 10: T2 · axial · 6.0mm · 1.19mm/px · 1 of 32 slices shown]
[im 1/32]
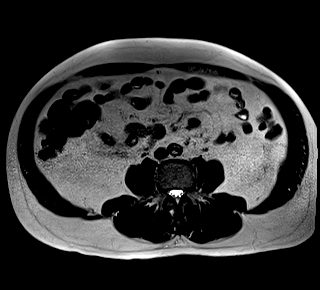

[Series 11: bSSFP · axial · 6.0mm · 0.74mm/px · 1 of 32 slices shown]
[im 1/32]
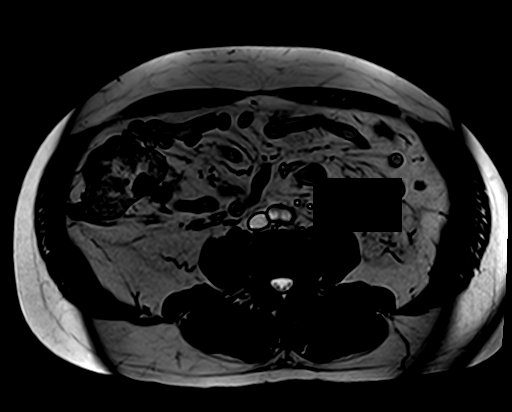

[Series 12: T1 dynamic · axial · non-contrast · 3.0mm · 1.19mm/px · z∈[-76,+137]mm · 3 of 72 slices shown (1 of 9)]
[im 1/72]
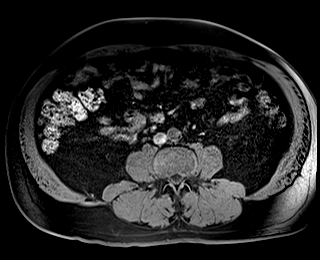
[im 36/72]
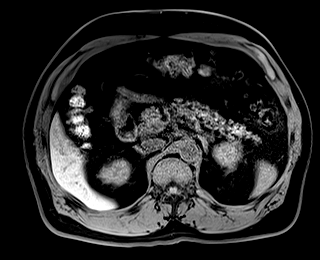
[im 72/72]
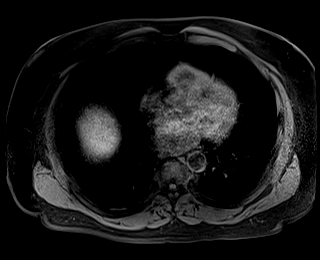

[Series 13: T1 dynamic · axial · 3.0mm · 1.19mm/px · z∈[-76,+137]mm · 3 of 72 slices shown (2 of 9)]
[im 1/72]
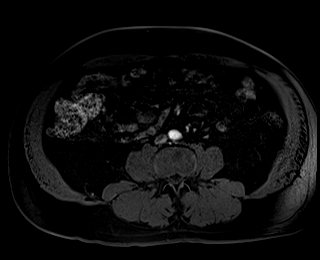
[im 36/72]
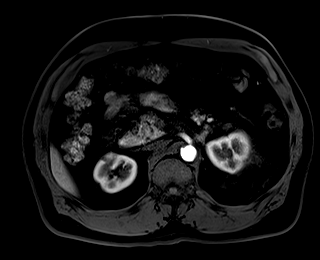
[im 72/72]
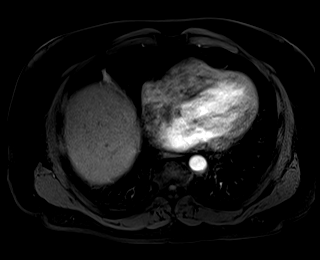

[Series 14: T1 dynamic · axial · 3.0mm · 1.19mm/px · z∈[-76,+137]mm · 3 of 72 slices shown (3 of 9)]
[im 1/72]
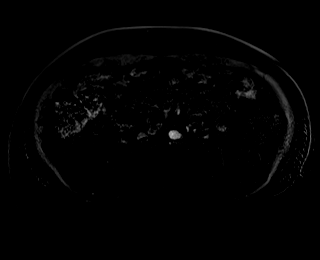
[im 36/72]
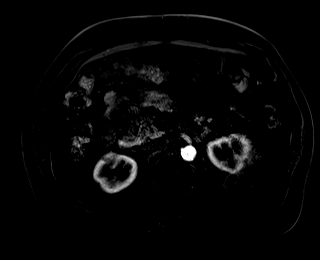
[im 72/72]
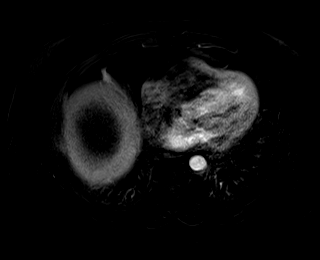

[Series 15: T1 dynamic · axial · 3.0mm · 1.19mm/px · z∈[-76,+137]mm · 3 of 72 slices shown (4 of 9)]
[im 1/72]
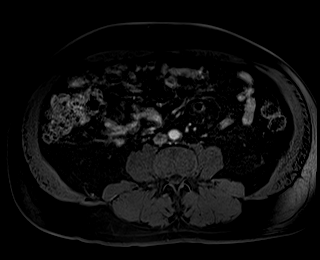
[im 36/72]
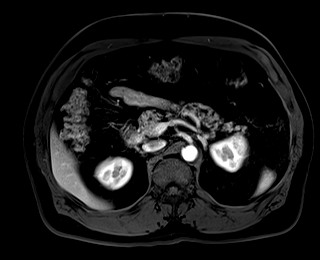
[im 72/72]
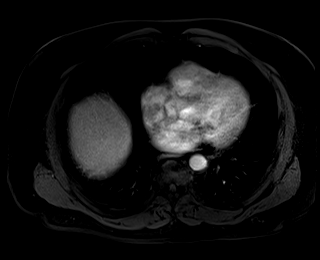

[Series 16: T1 dynamic · axial · 3.0mm · 1.19mm/px · z∈[-76,+137]mm · 3 of 72 slices shown (5 of 9)]
[im 1/72]
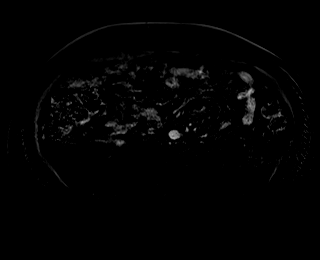
[im 36/72]
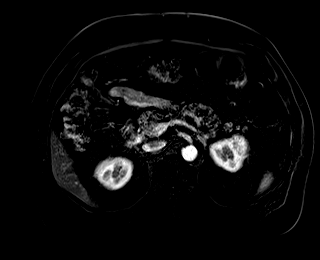
[im 72/72]
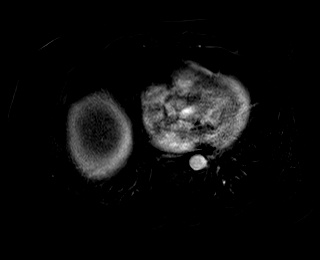

[Series 17: T1 dynamic · axial · 3.0mm · 1.19mm/px · z∈[-76,+137]mm · 3 of 72 slices shown (6 of 9)]
[im 1/72]
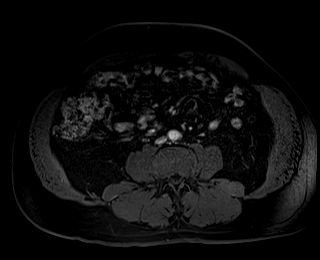
[im 36/72]
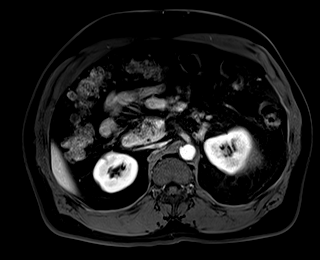
[im 72/72]
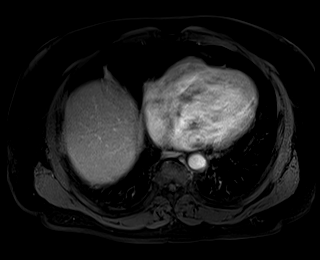

[Series 18: T1 dynamic · axial · 3.0mm · 1.19mm/px · z∈[-76,+137]mm · 3 of 72 slices shown (7 of 9)]
[im 1/72]
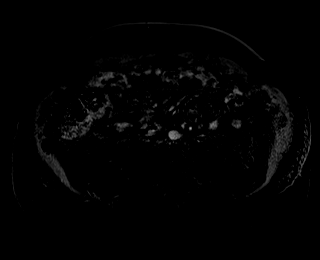
[im 36/72]
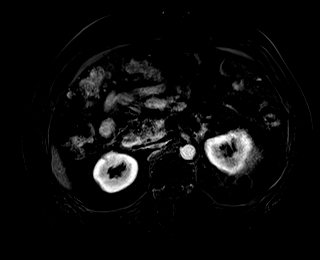
[im 72/72]
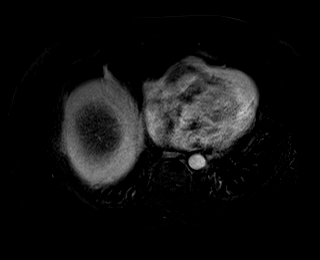

[Series 19: T1 dynamic post-contrast · coronal · 3.0mm · 1.31mm/px · 3 of 80 slices shown]
[im 1/80]
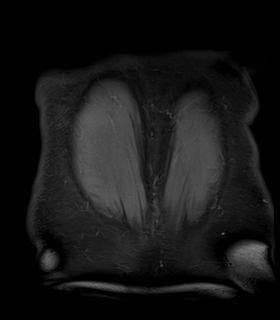
[im 40/80]
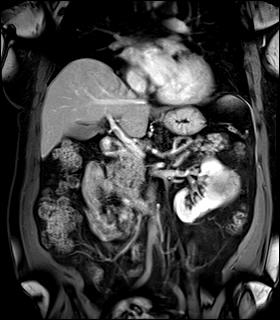
[im 80/80]
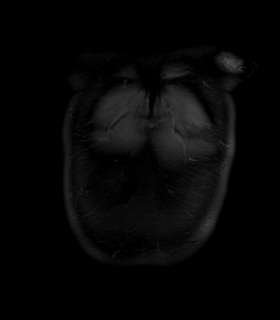

[Series 20: T1 dynamic · axial · 3.0mm · 1.19mm/px · z∈[-76,+137]mm · 3 of 72 slices shown (8 of 9)]
[im 1/72]
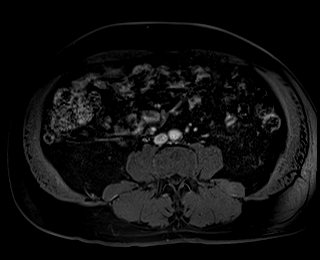
[im 36/72]
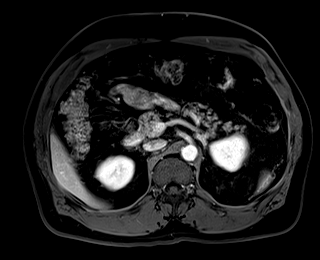
[im 72/72]
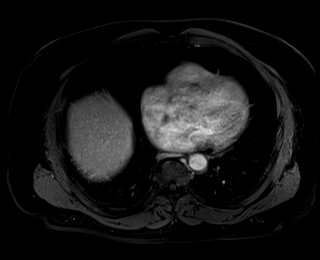

[Series 21: T1 dynamic · axial · 3.0mm · 1.19mm/px · z∈[-76,+137]mm · 3 of 72 slices shown (9 of 9)]
[im 1/72]
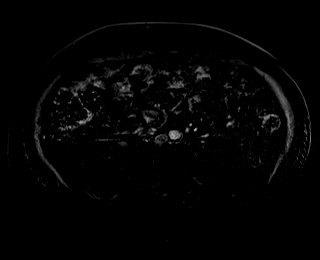
[im 36/72]
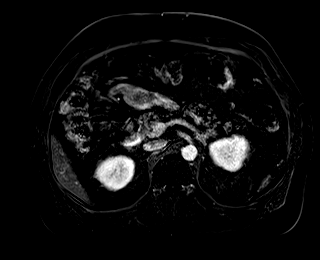
[im 72/72]
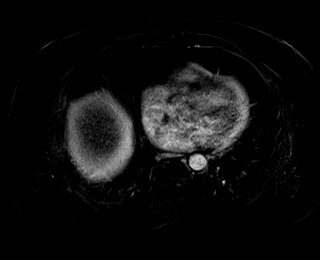

[48 of 48 positions shown; findings below may reference images not displayed]

FINDINGS: Lower chest: Unremarkable

Hepatobiliary: No hepatic steatosis. No suspicious hepatic lesion.
Gallbladder is unremarkable. No biliary ductal dilatation.

Pancreas: No mass, inflammatory changes, or other parenchymal
abnormality identified.

Spleen:  Within normal limits in size and appearance.

Adrenals/Urinary Tract: Bilateral adrenals are unremarkable. Right
kidney is normal appearance. No hydronephrosis.

Slight increase in size of the large heterogeneous interpolar left
renal lesion which now measures 8.2 x 7.2 cm previously 7.8 x 7.0 cm
on image 53/12. Again the lesion shows heterogeneous T1 and T2
hyperintensity without evidence macroscopic fat, with areas of T2
hypointensity suggesting hemosiderin. Similar to prior there is no
definite postcontrast enhancement seen on subtraction images.

Stomach/Bowel: Visualized portions of the stomach and small bowel
are unremarkable. Colonic diverticulosis.

Vascular/Lymphatic: No pathologically enlarged lymph nodes
identified. No abdominal aortic aneurysm demonstrated.

Other:  No ascites.

Musculoskeletal: No suspicious bone lesions identified.
IMPRESSION: 1. Slight increase in size of the large 8.2 cm complex hemorrhagic
left interpolar cystic lesion, which does not demonstrate definite
postcontrast enhancement seen on subtraction images. A benign
hemorrhagic cyst is favored, however given the size and complexity
of the cyst continued follow-up by MRI recommended in 6 months to
exclude underlying neoplasm.
2. Colonic diverticulosis.

## 2020-09-23 MED ORDER — GADOBUTROL 1 MMOL/ML IV SOLN
8.0000 mL | Freq: Once | INTRAVENOUS | Status: AC | PRN
  Administered 2020-09-23: 8 mL via INTRAVENOUS

## 2020-09-23 NOTE — Progress Notes (Signed)
IVT consulted for difficult PIV insertion.  Upon arrival, MRI personnel states they were able to obtain access.

## 2020-09-28 ENCOUNTER — Other Ambulatory Visit: Payer: Self-pay

## 2020-09-28 ENCOUNTER — Ambulatory Visit (INDEPENDENT_AMBULATORY_CARE_PROVIDER_SITE_OTHER): Payer: Medicare Other | Admitting: Urology

## 2020-09-28 VITALS — BP 144/80 | HR 76 | Wt 196.0 lb

## 2020-09-28 DIAGNOSIS — N2889 Other specified disorders of kidney and ureter: Secondary | ICD-10-CM

## 2020-10-07 ENCOUNTER — Other Ambulatory Visit: Payer: Self-pay | Admitting: Family Medicine

## 2020-10-07 DIAGNOSIS — I152 Hypertension secondary to endocrine disorders: Secondary | ICD-10-CM

## 2020-10-07 DIAGNOSIS — E1159 Type 2 diabetes mellitus with other circulatory complications: Secondary | ICD-10-CM

## 2020-10-07 NOTE — Telephone Encounter (Signed)
Called patient to schedule BP follow up. LVM to call back.

## 2020-10-07 NOTE — Telephone Encounter (Signed)
Please call pt and schedule BP follow-up asap. Dr. Selena Batten wanted him to follow-up before refilling this. Thank you!

## 2020-10-10 NOTE — Telephone Encounter (Signed)
Pt called in and scheduled for 3/16.

## 2020-10-12 ENCOUNTER — Other Ambulatory Visit: Payer: Self-pay

## 2020-10-12 ENCOUNTER — Ambulatory Visit (INDEPENDENT_AMBULATORY_CARE_PROVIDER_SITE_OTHER): Payer: Medicare Other | Admitting: Family Medicine

## 2020-10-12 ENCOUNTER — Encounter: Payer: Self-pay | Admitting: Family Medicine

## 2020-10-12 VITALS — BP 158/78 | HR 63 | Temp 97.4°F | Ht 65.0 in | Wt 196.0 lb

## 2020-10-12 DIAGNOSIS — J4520 Mild intermittent asthma, uncomplicated: Secondary | ICD-10-CM

## 2020-10-12 DIAGNOSIS — E1159 Type 2 diabetes mellitus with other circulatory complications: Secondary | ICD-10-CM

## 2020-10-12 DIAGNOSIS — I739 Peripheral vascular disease, unspecified: Secondary | ICD-10-CM

## 2020-10-12 DIAGNOSIS — E1122 Type 2 diabetes mellitus with diabetic chronic kidney disease: Secondary | ICD-10-CM

## 2020-10-12 DIAGNOSIS — N183 Chronic kidney disease, stage 3 unspecified: Secondary | ICD-10-CM

## 2020-10-12 DIAGNOSIS — I152 Hypertension secondary to endocrine disorders: Secondary | ICD-10-CM

## 2020-10-12 MED ORDER — AMLODIPINE BESYLATE 10 MG PO TABS: 10.0000 mg | ORAL_TABLET | Freq: Every day | ORAL | 3 refills | Status: DC

## 2020-10-12 MED ORDER — METFORMIN HCL 500 MG PO TABS: 500.0000 mg | ORAL_TABLET | Freq: Two times a day (BID) | ORAL | 1 refills | Status: DC

## 2020-10-12 NOTE — Assessment & Plan Note (Signed)
Cont lipitor and asa 

## 2020-10-12 NOTE — Progress Notes (Signed)
Subjective:     Donald Skinner is a 70 y.o. male presenting for Follow-up (BP )     HPI  #Asthma - had a coughing spell this morning with emesis - did not take albuterol  #Diabetes - ran out of metformin - stopped eating the sweets and has been eating more veggies  #HTN - has been checking at home - fell a couple of weeks ago - went to stand up and felt dizzy and fell - bp at home 191/91 - last night 148/77 - did noticed some swelling but no bad   Review of Systems   Social History   Tobacco Use  Smoking Status Never Smoker  Smokeless Tobacco Never Used        Objective:    BP Readings from Last 3 Encounters:  10/12/20 (!) 158/78  09/28/20 (!) 144/80  09/13/20 (!) 162/94   Wt Readings from Last 3 Encounters:  10/12/20 196 lb (88.9 kg)  09/28/20 196 lb (88.9 kg)  09/13/20 196 lb 4 oz (89 kg)    BP (!) 158/78   Pulse 63   Temp (!) 97.4 F (36.3 C) (Temporal)   Ht 5\' 5"  (1.651 m)   Wt 196 lb (88.9 kg)   SpO2 97%   BMI 32.62 kg/m    Physical Exam Constitutional:      Appearance: Normal appearance. He is not ill-appearing or diaphoretic.  HENT:     Right Ear: External ear normal.     Left Ear: External ear normal.  Eyes:     General: No scleral icterus.    Extraocular Movements: Extraocular movements intact.     Conjunctiva/sclera: Conjunctivae normal.  Cardiovascular:     Rate and Rhythm: Normal rate and regular rhythm.     Heart sounds: No murmur heard.   Pulmonary:     Effort: Pulmonary effort is normal. No respiratory distress.     Breath sounds: Normal breath sounds. No wheezing.  Musculoskeletal:     Cervical back: Neck supple.  Skin:    General: Skin is warm and dry.  Neurological:     Mental Status: He is alert. Mental status is at baseline.  Psychiatric:        Mood and Affect: Mood normal.           Assessment & Plan:   Problem List Items Addressed This Visit      Cardiovascular and Mediastinum   Hypertension  associated with diabetes (HCC)    BP elevated. Pt notes poor dietary choices. Work on . Cont carvedilol 25 mg bid. Increase amlodipine 10 mg. If elevated at f/u will likely add Thiazide diuretic as intolerance of ACE/ARB      Relevant Medications   metFORMIN (GLUCOPHAGE) 500 MG tablet   amLODipine (NORVASC) 10 MG tablet   PAD (peripheral artery disease) (HCC)    Cont lipitor and asa      Relevant Medications   amLODipine (NORVASC) 10 MG tablet     Respiratory   Mild intermittent asthma without complication    Post-tussive emesis x 1 today. Clear lungs. 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 He is increasing to 500 mg bid. Cont actos. Return 2 months. Cont to work on diet.       Relevant Medications   metFORMIN (GLUCOPHAGE)  500 MG tablet       Return in about 2 months (around 12/12/2020) for Diabetes.  Lynnda Child, MD  This visit occurred during the SARS-CoV-2 public health emergency.  Safety protocols were in place, including screening questions prior to the visit, additional usage of staff PPE, and extensive cleaning of exam room while observing appropriate contact time as indicated for disinfecting solutions.

## 2020-10-12 NOTE — Assessment & Plan Note (Signed)
BP elevated. Pt notes poor dietary choices. Work on [employer]. Cont carvedilol 25 mg bid. Increase amlodipine 10 mg. If elevated at f/u will likely add Thiazide diuretic as intolerance of ACE/ARB

## 2020-10-12 NOTE — Patient Instructions (Addendum)
#  Blood pressure - increase amlodipine to 10 mg daily  - continue carvedilol - Try to think of why you stopped Hydrochlorothiazide  #Diabetes - continue working on diet - continue actos and metformin twice daily  #Asthma - use albuterol inhaler if coughing or wheezing

## 2020-10-12 NOTE — Assessment & Plan Note (Signed)
Post-tussive emesis x 1 today. Clear lungs. Discussed using albuterol prn during allergy season

## 2020-10-12 NOTE — Assessment & Plan Note (Signed)
Pt declined increase in metformin to 1000 mg bid. He is increasing to 500 mg bid. Cont actos. Return 2 months. Cont to work on diet.

## 2020-10-17 NOTE — Progress Notes (Signed)
09/28/2020 5:00 PM   Donald Skinner Apr 20, 1951 [id number]  Referring provider: Lesleigh Noe, MD [place],  [place] [address]  Chief Complaint  Patient presents with  . Follow-up    HPI: 70 year old male who presents today for 6-week follow-up of MRI.  Renal ultrasound performed 09/30/2019 which was performed in [place] remarkable for a 6.3 x 6.3 x 6.8 cm left renal mass. Prior MRI from 05/30/2018 showed mass measuring 7.1 x 7.4 cm. Images not available but felt highly suspicious for renal cell carcinoma.  Abdominal MRI w/w/o contrast on 02/22/2020 showed 7.8 cm complex hemorrhagic cystic lesion in the lateral midpole of the left kidney.  A benign hemorrhagic cyst is favored. No evidence of abdominal metastatic disease. Colonic diverticulosis.  He follows up today with repeat MRI.  This was performed on 09/23/2018.  This shows slight interval enlargement up to 8.2 cm but there is no definitive enhancement on today's study and per the radiologist, it is favoring a benign hemorrhagic cyst.  There are some complexity to the cyst.  No flank pain.  No urinary issues.   PMH: Past Medical History:  Diagnosis Date  . Allergy   . Diabetes mellitus without complication ([place])   . Heart disease   . Hyperlipidemia   . Hypertension   . Myocardial infarction [facility])     Surgical History: Past Surgical History:  Procedure Laterality Date  . Cardiac stents     x 6 stents- had 5 heart attacks    Home Medications:  Allergies as of 09/28/2020      Reactions   Lipitor [atorvastatin]    Losartan Potassium Swelling   Tongue swelling   Lisinopril Cough   Rosuvastatin Other (See Comments)   myalgias   Simvastatin Other (See Comments)   weakness      Medication List       Accurate as of September 28, 2020 11:59 PM. If you have any questions, ask your nurse or doctor.        Accu-Chek Softclix Lancets lancets TEST UP TO FOUR TIMES DAILY AS DIRECTED   albuterol 108 (90  Base) MCG/ACT inhaler Commonly known as: VENTOLIN HFA Inhale 2 puffs into the lungs every 6 (six) hours as needed for wheezing or shortness of breath.   amLODipine 5 MG tablet Commonly known as: NORVASC Take 1 tablet (5 mg total) by mouth daily.   ASPIRIN 81 PO Take by mouth. daily   atorvastatin 80 MG tablet Commonly known as: LIPITOR Take 80 mg by mouth daily.   blood glucose meter kit and supplies Dispense based on patient and insurance preference. Use up to four times daily as directed. (FOR ICD-10 E10.9, E11.9).   carvedilol 25 MG tablet Commonly known as: COREG Take 1 tablet (25 mg total) by mouth 2 (two) times daily with a meal.   clopidogrel 75 MG tablet Commonly known as: PLAVIX TAKE 1 TABLET(75 MG) BY MOUTH DAILY   metFORMIN 500 MG tablet Commonly known as: GLUCOPHAGE Take 1 tablet (500 mg total) by mouth 2 (two) times daily with a meal.   nitroGLYCERIN 0.4 MG SL tablet Commonly known as: NITROSTAT Place 0.4 mg under the tongue every 5 (five) minutes as needed for chest pain.   pioglitazone 45 MG tablet Commonly known as: ACTOS TAKE 1 TABLET(45 MG) BY MOUTH DAILY   VITAMIN D PO Take 50 mcg by mouth daily.       Allergies:  Allergies  Allergen Reactions  . Lipitor [Atorvastatin]   .  Losartan Potassium Swelling    Tongue swelling  . Lisinopril Cough  . Rosuvastatin Other (See Comments)    myalgias  . Simvastatin Other (See Comments)    weakness    Family History: Family History  Problem Relation Age of Onset  . Alcohol abuse Mother   . Arthritis Mother   . Alcohol abuse Father   . Heart attack Father 83  . Hypertension Father   . Alcohol abuse Brother   . Drug abuse Brother   . Alcohol abuse Brother   . Mental illness Brother     Social History:  reports that he has never smoked. He has never used smokeless tobacco. He reports previous alcohol use. He reports that he does not use drugs.   Physical Exam: BP (!) 144/80   Pulse 76   Wt  196 lb (88.9 kg)   BMI 32.62 kg/m   Constitutional:  Alert and oriented, No acute distress. HEENT: [place] AT, moist mucus membranes.  Trachea midline, no masses. Cardiovascular: No clubbing, cyanosis, or edema. Respiratory: Normal respiratory effort, no increased work of breathing. Skin: No rashes, bruises or suspicious lesions. Neurologic: Grossly intact, no focal deficits, moving all 4 extremities. Psychiatric: Normal mood and affect.  Laboratory Data: Lab Results  Component Value Date   WBC 5.8 09/13/2020   HGB 13.9 09/13/2020   HCT 42.7 09/13/2020   MCV 89.3 09/13/2020   PLT 135.0 (L) 09/13/2020    Lab Results  Component Value Date   CREATININE 1.54 (H) 09/13/2020     Lab Results  Component Value Date   HGBA1C 8.1 (H) 09/13/2020     Pertinent Imaging: IMPRESSION: 1. Slight increase in size of the large 8.2 cm complex hemorrhagic left interpolar cystic lesion, which does not demonstrate definite postcontrast enhancement seen on subtraction images. A benign hemorrhagic cyst is favored, however given the size and complexity of the cyst continued follow-up by MRI recommended in 6 months to exclude underlying neoplasm. 2. Colonic diverticulosis.   Electronically Signed   By: Dahlia Bailiff MD   On: 09/23/2020 16:36  MRI was personally reviewed today and also compared to his previous.  Assessment & Plan:    1. Renal mass Cystic renal mass with slight interval enlargement although no definitive enhancement which is favoring more benign pathology.  As such, recommend continued surveillance.  Plan for MRI in 6 months.  All questions answered. - MR Abdomen W Wo Contrast; Future   Hollice Espy, MD  [facility] [address], [place], [place] [address] [phone]

## 2020-11-10 LAB — HM DIABETES EYE EXAM

## 2020-11-14 ENCOUNTER — Other Ambulatory Visit: Payer: Self-pay | Admitting: Family Medicine

## 2020-11-21 ENCOUNTER — Telehealth: Payer: Self-pay

## 2020-11-21 NOTE — Telephone Encounter (Signed)
Noted will see in office tomorrow 

## 2020-11-21 NOTE — Telephone Encounter (Signed)
Patient came into the office today complaining of high blood pressure, chest pain, SOB, R arm pain, and headache that has been occurring intermittently for about three weeks. Patient denied these symptoms currently, but stated that they mostly occur during activity. Neuro assessment normal with no abnormalities. B/P was 160/82 and HR was 70. Spoke with Selena Batten, MD who stated that patient should be seen for a F/U visit with her as soon as possible and that if he experienced chest pain or SOB to go to ED. MD also stated for patient to avoid activity until seen. Instructed patient of Dr. Elmyra Ricks recommendations and patient verbalized understanding. Patient has appointment with Dr. Selena Batten tomorrow (4/26) at 4:20.

## 2020-11-22 ENCOUNTER — Other Ambulatory Visit: Payer: Self-pay

## 2020-11-22 ENCOUNTER — Ambulatory Visit (INDEPENDENT_AMBULATORY_CARE_PROVIDER_SITE_OTHER): Payer: Medicare Other | Admitting: Family Medicine

## 2020-11-22 VITALS — BP 160/80 | HR 71 | Temp 97.9°F | Wt 193.0 lb

## 2020-11-22 DIAGNOSIS — I25118 Atherosclerotic heart disease of native coronary artery with other forms of angina pectoris: Secondary | ICD-10-CM | POA: Diagnosis not present

## 2020-11-22 DIAGNOSIS — E1159 Type 2 diabetes mellitus with other circulatory complications: Secondary | ICD-10-CM

## 2020-11-22 DIAGNOSIS — G459 Transient cerebral ischemic attack, unspecified: Secondary | ICD-10-CM | POA: Insufficient documentation

## 2020-11-22 DIAGNOSIS — R079 Chest pain, unspecified: Secondary | ICD-10-CM | POA: Diagnosis not present

## 2020-11-22 DIAGNOSIS — I152 Hypertension secondary to endocrine disorders: Secondary | ICD-10-CM

## 2020-11-22 MED ORDER — HYDROCHLOROTHIAZIDE 25 MG PO TABS: 25.0000 mg | ORAL_TABLET | Freq: Every day | ORAL | 3 refills | Status: DC

## 2020-11-22 NOTE — Assessment & Plan Note (Signed)
Pt with prior MI and CAD with exertional cp. Seems consistent with stable angina but also overdue for cardiology f/u. EKG with ST changes in V3/V4 from prior in 11/2019. Given no CP currently advised limiting activity until able to see cardiology. Taking nitroglycerine and going the ER if persistent CP. Declined trial of IMDUR

## 2020-11-22 NOTE — Progress Notes (Signed)
Subjective:     Donald Skinner is a 70 y.o. male presenting for Hypertension     HPI  #HTN - taking carvedilol 25 mg and amlodipine 10 mg  #CP - 4 weeks ago - heaviness on the chest "like a heart attack" - also felt like the entire left side was number and the feet were heavy - got up and started moving around the next day and he was better - lasted for <24 hours and was able to move around  Had chest pain yesterday - was on a walk when it started, lasted 1 minute and resolved with rest  Has been getting CP whenever he walks or is active Center or chest - pressure sensation   Takes plavix and asa Did not take a nitroglycerine when the pain arrived Continues to take lipitor   Review of Systems  Constitutional: Negative for chills and fever.  Eyes: Negative for visual disturbance.  Respiratory: Negative for chest tightness and shortness of breath.   Cardiovascular: Negative for chest pain, palpitations and leg swelling.  Neurological: Negative for headaches.     Social History   Tobacco Use  Smoking Status Never Smoker  Smokeless Tobacco Never Used        Objective:    BP Readings from Last 3 Encounters:  11/22/20 (!) 160/80  10/12/20 (!) 158/78  09/28/20 (!) 144/80   Wt Readings from Last 3 Encounters:  11/22/20 193 lb (87.5 kg)  10/12/20 196 lb (88.9 kg)  09/28/20 196 lb (88.9 kg)    BP (!) 160/80   Pulse 71   Temp 97.9 F (36.6 C) (Temporal)   Wt 193 lb (87.5 kg)   SpO2 96%   BMI 32.12 kg/m    Physical Exam Constitutional:      Appearance: Normal appearance. He is not ill-appearing or diaphoretic.  HENT:     Head: Normocephalic and atraumatic.     Right Ear: External ear normal.     Left Ear: External ear normal.     Nose: Nose normal.     Mouth/Throat:     Mouth: Mucous membranes are moist.  Eyes:     General: No scleral icterus.    Extraocular Movements: Extraocular movements intact.     Conjunctiva/sclera: Conjunctivae normal.      Pupils: Pupils are equal, round, and reactive to light.  Cardiovascular:     Rate and Rhythm: Normal rate and regular rhythm.     Heart sounds: Murmur heard.    Pulmonary:     Effort: Pulmonary effort is normal. No respiratory distress.     Breath sounds: Normal breath sounds. No wheezing.  Musculoskeletal:     Cervical back: Neck supple.  Skin:    General: Skin is warm and dry.  Neurological:     Mental Status: He is alert. Mental status is at baseline.     Cranial Nerves: No cranial nerve deficit.     Motor: No weakness.     Coordination: Coordination normal.     Deep Tendon Reflexes: Reflexes normal.  Psychiatric:        Mood and Affect: Mood normal.        Behavior: Behavior normal.     EKG: NSR, ST changes in V3/V4 from prior.      Assessment & Plan:   Problem List Items Addressed This Visit      Cardiovascular and Mediastinum   Hypertension associated with diabetes (HCC) - Primary    Poorly controlled. Start HCTZ 25  mg. Return 4 weeks. Considered Imdur but patient worried about side effects. Cont amlodipine 10 mg and carvedilol 25 mg bid. Side effects to losartain/lisinopril       Relevant Medications   hydrochlorothiazide (HYDRODIURIL) 25 MG tablet   Other Relevant Orders   Comprehensive metabolic panel   CBC   Coronary artery disease of native artery of native heart with stable angina pectoris (HCC)   Relevant Medications   hydrochlorothiazide (HYDRODIURIL) 25 MG tablet   TIA (transient ischemic attack)    Risk factors for stroke - DM, CAD, HTN - with episode of parathesias and heaviness of the right side a few weeks ago which resolved within 24 hours. Did not seek medication attention. Seems consistent with possible TIA. Already taking asa 81 mg, plavix 75 mg and atorvastatin 80 mg. Discussed neurology but patient would like to get cardiology and cp/HTN addressed prior to pursuing work-up. ER precautions.       Relevant Medications   hydrochlorothiazide  (HYDRODIURIL) 25 MG tablet     Other   Exertional chest pain    Pt with prior MI and CAD with exertional cp. Seems consistent with stable angina but also overdue for cardiology f/u. EKG with ST changes in V3/V4 from prior in 11/2019. Given no CP currently advised limiting activity until able to see cardiology. Taking nitroglycerine and going the ER if persistent CP. Declined trial of IMDUR       Other Visit Diagnoses    Chest pain, unspecified type       Relevant Orders   EKG 12-Lead (Completed)   Comprehensive metabolic panel   CBC       Return in about 4 weeks (around 12/20/2020).  Lynnda Child, MD  This visit occurred during the SARS-CoV-2 public health emergency.  Safety protocols were in place, including screening questions prior to the visit, additional usage of staff PPE, and extensive cleaning of exam room while observing appropriate contact time as indicated for disinfecting solutions.

## 2020-11-22 NOTE — Patient Instructions (Addendum)
#   Chest pain - avoid extensive activity - take nitroglycerine if you have severe chest pain  - if chest pain occurs and does not resolve go the ER  Call Dr. Okey Dupre  [phone]   To make an appointment as soon as possible  #Blood pressure - start Hydrochlorothiazide 25 mg daily - continue to monitor blood pressure at home

## 2020-11-22 NOTE — Assessment & Plan Note (Signed)
Poorly controlled. Start HCTZ 25 mg. Return 4 weeks. Considered Imdur but patient worried about side effects. Cont amlodipine 10 mg and carvedilol 25 mg bid. Side effects to losartain/lisinopril

## 2020-11-22 NOTE — Assessment & Plan Note (Signed)
Risk factors for stroke - DM, CAD, HTN - with episode of parathesias and heaviness of the right side a few weeks ago which resolved within 24 hours. Did not seek medication attention. Seems consistent with possible TIA. Already taking asa 81 mg, plavix 75 mg and atorvastatin 80 mg. Discussed neurology but patient would like to get cardiology and cp/HTN addressed prior to pursuing work-up. ER precautions.

## 2020-11-23 LAB — COMPREHENSIVE METABOLIC PANEL
ALT: 15 U/L (ref 0–53)
AST: 15 U/L (ref 0–37)
Albumin: 4.2 g/dL (ref 3.5–5.2)
Alkaline Phosphatase: 59 U/L (ref 39–117)
BUN: 23 mg/dL (ref 6–23)
CO2: 21 mEq/L (ref 19–32)
Calcium: 9.5 mg/dL (ref 8.4–10.5)
Chloride: 105 mEq/L (ref 96–112)
Creatinine, Ser: 1.66 mg/dL — ABNORMAL HIGH (ref 0.40–1.50)
GFR: 41.02 mL/min — ABNORMAL LOW (ref >60.00)
Glucose, Bld: 111 mg/dL — ABNORMAL HIGH (ref 70–99)
Potassium: 4.6 mEq/L (ref 3.5–5.1)
Sodium: 138 mEq/L (ref 135–145)
Total Bilirubin: 0.8 mg/dL (ref 0.2–1.2)
Total Protein: 7.4 g/dL (ref 6.0–8.3)

## 2020-11-23 LAB — CBC
HCT: 43.6 % (ref 39.0–52.0)
Hemoglobin: 14.4 g/dL (ref 13.0–17.0)
MCHC: 32.9 g/dL (ref 30.0–36.0)
MCV: 87.5 fl (ref 78.0–100.0)
Platelets: 138 10*3/uL — ABNORMAL LOW (ref 150.0–400.0)
RBC: 4.98 Mil/uL (ref 4.22–5.81)
RDW: 15.2 % (ref 11.5–15.5)
WBC: 5.5 10*3/uL (ref 4.0–10.5)

## 2020-11-29 NOTE — Progress Notes (Signed)
Office Visit    Patient Name: Donald Skinner Date of Encounter: 11/29/2020  PCP:  Lynnda Child, MD   [facility]  Cardiologist:  Yvonne Kendall, MD  Advanced Practice Provider:  No care team member to display Electrophysiologist:  None   Chief Complaint    Donald Skinner is a 70 y.o. male with a hx of CAD, ICM with LVEF 35-40% by echo 07/2016, HTN, DM2 presents today for CAD follow up.   Past Medical History    Past Medical History:  Diagnosis Date  . Allergy   . Diabetes mellitus without complication (HCC)   . Heart disease   . Hyperlipidemia   . Hypertension   . Myocardial infarction [facility])    Past Surgical History:  Procedure Laterality Date  . Cardiac stents     x 6 stents- had 5 heart attacks    Allergies  Allergies  Allergen Reactions  . Lipitor [Atorvastatin]   . Losartan Potassium Swelling    Tongue swelling  . Lisinopril Cough  . Rosuvastatin Other (See Comments)    myalgias  . Simvastatin Other (See Comments)    weakness    History of Present Illness    Donald Skinner is a 70 y.o. male with a hx of CAD, ICM with LVEF 35-40% by echo 07/2016, HTN, DM2 last seen by Dr. Okey Dupre 12/09/19.  His initial PCI was to LAD in 2003. Subseqeutn PCI to prox LAD and mid LCx in 06/2005. 02/2010 he had PCI to prox LAD for ISR (patient declined single vessel CABG). He had PCI to prox LCX 08/2012 in setting of NSTEMI and later that year 12/2012 with PCI to prox RCA in setting of STEMI. Most recent catheterization 07/2016 in setting of NSTEMI noted to have severe multivessel CAD not amenable to PCI or CABG. Echo at that time with LVEF 35-40%, moderate MR, normal diastolic function, normal PA pressure.   Seen by PCP 11/22/20 and started on HCTZ for hypertension. He additionally noted 24 hour history of TIA like symptoms with parathesias and heaviness which self resolved. He was recommended for referral to neurology.  He presents today for follow up. Endorses BP has  still been elevated >130/80 at home but has not yet picked up HCTZ.  Reports no shortness of breath at rest and notes new dyspnea on exertion over the last month. He attributes this to after his TIA-like episode and wonders whether it is related to his heart or to his inactivity over the last few weeks. He previously was walking regularly for exercise. Reports chest tightness with exertion and dyspnea. No edema, orthopnea, PND. Reports no palpitations. Denies near syncope, syncope.  EKGs/Labs/Other Studies Reviewed:   The following studies were reviewed today:   EKG:  EKG is  ordered today.  The ekg ordered today demonstrates SB 58 bpm with stable ST/T wave abnormalities in lateral and anterior leads. Inferior TWI more pronounched than previous.   Recent Labs: 11/22/2020: ALT 15; BUN 23; Creatinine, Ser 1.66; Hemoglobin 14.4; Platelets 138.0; Potassium 4.6; Sodium 138  Recent Lipid Panel    Component Value Date/Time   CHOL 129 09/13/2020 1028   TRIG 62.0 09/13/2020 1028   HDL 53.80 09/13/2020 1028   CHOLHDL 2 09/13/2020 1028   VLDL 12.4 09/13/2020 1028   LDLCALC 63 09/13/2020 1028    Home Medications   No outpatient medications have been marked as taking for the 11/30/20 encounter (Appointment) with Alver Sorrow, NP.     Review  of Systems  All other systems reviewed and are otherwise negative except as noted above.  Physical Exam    VS:  There were no vitals taken for this visit. , BMI There is no height or weight on file to calculate BMI.  Wt Readings from Last 3 Encounters:  11/22/20 193 lb (87.5 kg)  10/12/20 196 lb (88.9 kg)  09/28/20 196 lb (88.9 kg)     GEN: Well nourished, overweight, well developed, in no acute distress. HEENT: normal. Neck: Supple, no JVD, carotid bruits, or masses. Cardiac: RRR, no murmurs, rubs, or gallops. No clubbing, cyanosis, edema.  Radials/DP/PT 2+ and equal bilaterally.  Respiratory:  Respirations regular and unlabored, clear to  auscultation bilaterally. GI: Soft, nontender, nondistended. MS: No deformity or atrophy. Skin: Warm and dry, no rash. Neuro:  Strength and sensation are intact. Psych: Normal affect.  Assessment & Plan    1. CAD - EKG today shows SB with stable ST/T wave abnormalities in lateral and anterior leads. Inferior TWI more pronounched than previous. Notes 2-3 week history of worsening exertional dyspnea associated with some chest tightness. Etiology worsening angina vs deconditioning in setting of inactivity. Recommend ischemic evaluation. Discussed treadmill vs lexiscan myoview and he prefers to try treadmill first. He was agreeable that if unable to obtain adequate exercise on treadmill, lexiscan myoview would be alternative. The risks [chest pain, shortness of breath, cardiac arrhythmias, dizziness, blood pressure fluctuations, myocardial infarction, stroke/transient ischemic attack, nausea, vomiting, allergic reaction, radiation exposure, metallic taste sensation and life-threatening complications (estimated to be 1 in 10,000)], benefits (risk stratification, diagnosing coronary artery disease, treatment guidance) and alternatives of a nuclear stress test were discussed in detail with Mr. Mcfadyen and he agrees to proceed. GDMT includes aspirin, coreg, plavix, atorvastatin, PRN nitroglycerin.  2. HFrEF / ICM - Euvolemic and well compensated on exam. GDMT includes Coreg. No indication for loop diuretic. Ideally, consider alternate diabetic agent to Actos due to heart failure.  3. HTN - BP reasonably well controlled in clinic but not at goal at home. Recommend start HCTZ 25mg  QD as recommended by PCP. Continue Coreg 25mg  BID, Amlodipine 10mg  daily. Previous intolerance to ACE/ARB.  4. HLD - Continue Atorvastatin 80mg  daily.  Disposition: Follow up in 1 month(s) with Dr. or APP   Signed, , NP 11/29/2020, 9:37 AM [facility]

## 2020-11-30 ENCOUNTER — Ambulatory Visit (INDEPENDENT_AMBULATORY_CARE_PROVIDER_SITE_OTHER): Payer: Medicare Other | Admitting: Family

## 2020-11-30 ENCOUNTER — Other Ambulatory Visit: Payer: Self-pay

## 2020-11-30 ENCOUNTER — Encounter: Payer: Self-pay | Admitting: Family

## 2020-11-30 VITALS — BP 124/84 | HR 58 | Ht 65.0 in | Wt 193.0 lb

## 2020-11-30 DIAGNOSIS — I25118 Atherosclerotic heart disease of native coronary artery with other forms of angina pectoris: Secondary | ICD-10-CM

## 2020-11-30 DIAGNOSIS — I5022 Chronic systolic (congestive) heart failure: Secondary | ICD-10-CM | POA: Diagnosis not present

## 2020-11-30 DIAGNOSIS — E785 Hyperlipidemia, unspecified: Secondary | ICD-10-CM | POA: Diagnosis not present

## 2020-11-30 DIAGNOSIS — R072 Precordial pain: Secondary | ICD-10-CM

## 2020-11-30 DIAGNOSIS — Z79899 Other long term (current) drug therapy: Secondary | ICD-10-CM | POA: Diagnosis not present

## 2020-11-30 NOTE — Patient Instructions (Addendum)
Medication Instructions:  Your physician has recommended you make the following change in your medication:   START Hydrochlorothiazide as recommended by primary care provider   *If you need a refill on your cardiac medications before your next appointment, please call your pharmacy*   Lab Work: None ordered today.   Testing/Procedures:  - Your physician has requested that you have an exercise stress myoview (Cardiac Nuclear Scan)   [facility] MYOVIEW  Your caregiver has ordered a Stress Test with nuclear imaging. The purpose of this test is to evaluate the blood supply to your heart muscle. This procedure is referred to as a "Non-Invasive Stress Test." This is because other than having an IV started in your vein, nothing is inserted or "invades" your body. Cardiac stress tests are done to find areas of poor blood flow to the heart by determining the extent of coronary artery disease (CAD). Some patients exercise on a treadmill, which naturally increases the blood flow to your heart, while others who are  unable to walk on a treadmill due to physical limitations have a pharmacologic/chemical stress agent called Lexiscan . This medicine will mimic walking on a treadmill by temporarily increasing your coronary blood flow.   Please note: these test may take anywhere between 2-4 hours to complete  PLEASE REPORT TO [facility] MEDICAL MALL ENTRANCE  THE VOLUNTEERS AT THE FIRST DESK WILL DIRECT YOU WHERE TO GO  Date of Procedure:_____________________________________  Arrival Time for Procedure:______________________________  Instructions regarding medication:   __x__ : Hold diabetes medication morning of procedure (METFORMIN/ ACTOS)  _x___:  Hold betablocker(s) night before procedure and morning of procedure (CARVEDILOL)  __x__:  Hold other HCTZ the morning of your procedure  PLEASE NOTIFY THE OFFICE AT LEAST 24 HOURS IN ADVANCE IF YOU ARE UNABLE TO KEEP YOUR APPOINTMENT.  [phone] AND  PLEASE  NOTIFY NUCLEAR MEDICINE AT [facility] AT LEAST 24 HOURS IN ADVANCE IF YOU ARE UNABLE TO KEEP YOUR APPOINTMENT. [phone]  How to prepare for your Myoview test:  1. Do not eat or drink after midnight 2. No caffeine for 24 hours prior to test 3. No smoking 24 hours prior to test. 4. Your medication may be taken with water.  If your doctor stopped a medication because of this test, do not take that medication. 5. Ladies, please do not wear dresses.  Skirts or pants are appropriate. Please wear a short sleeve shirt. 6. No perfume, cologne or lotion. 7. Wear comfortable walking shoes. No heels!     Follow-Up: At [facility], you and your health needs are our priority.  As part of our continuing mission to provide you with exceptional heart care, we have created designated Provider Care Teams.  These Care Teams include your primary Cardiologist (physician) and Advanced Practice Providers (APPs -  Physician Assistants and Nurse Practitioners) who all work together to provide you with the care you need, when you need it.  We recommend signing up for the patient portal called "MyChart".  Sign up information is provided on this After Visit Summary.  MyChart is used to connect with patients for Virtual Visits (Telemedicine).  Patients are able to view lab/test results, encounter notes, upcoming appointments, etc.  Non-urgent messages can be sent to your provider as well.   To learn more about what you can do with MyChart, go to [email or web address].    Your next appointment:   1 month(s)  The format for your next appointment:   In Person  Provider:   You may see  Yvonne Kendall, MD or one of the following Advanced Practice Providers on your designated Care Team:    Nicolasa Ducking, NP  Eula Listen, PA-C  Marisue Ivan, PA-C  Cadence Fransico Michael, [place]  Gillian Shields, NP  Other Instructions  Heart Healthy Diet Recommendations: A low-salt diet is recommended. Meats should be grilled,  baked, or boiled. Avoid fried foods. Focus on lean protein sources like fish or chicken with vegetables and fruits. The American Heart Association is a Chief Technology Officer!  American Heart Association Diet and Lifeystyle Recommendations   Exercise recommendations: The American Heart Association recommends 150 minutes of moderate intensity exercise weekly. Try 30 minutes of moderate intensity exercise 4-5 times per week. This could include walking, jogging, or swimming.    Cardiac Nuclear Scan A cardiac nuclear scan is a test that measures blood flow to the heart when a person is resting and when he or she is exercising. The test looks for problems such as:  Not enough blood reaching a portion of the heart.  The heart muscle not working normally. You may need this test if:  You have heart disease.  You have had abnormal lab results.  You have had heart surgery or a balloon procedure to open up blocked arteries (angioplasty).  You have chest pain.  You have shortness of breath. In this test, a radioactive dye (tracer) is injected into your bloodstream. After the tracer has traveled to your heart, an imaging device is used to measure how much of the tracer is absorbed by or distributed to various areas of your heart. This procedure is usually done at a hospital and takes 2-4 hours. Tell a health care provider about:  Any allergies you have.  All medicines you are taking, including vitamins, herbs, eye drops, creams, and over-the-counter medicines.  Any problems you or family members have had with anesthetic medicines.  Any blood disorders you have.  Any surgeries you have had.  Any medical conditions you have.  Whether you are pregnant or may be pregnant. What are the risks? Generally, this is a safe procedure. However, problems may occur, including:  Serious chest pain and heart attack. This is only a risk if the stress portion of the test is done.  Rapid  heartbeat.  Sensation of warmth in your chest. This usually passes quickly.  Allergic reaction to the tracer. What happens before the procedure?  Ask your health care provider about changing or stopping your regular medicines. This is especially important if you are taking diabetes medicines or blood thinners.  Follow instructions from your health care provider about eating or drinking restrictions.  Remove your jewelry on the day of the procedure. What happens during the procedure?  An IV will be inserted into one of your veins.  Your health care provider will inject a small amount of radioactive tracer through the IV.  You will wait for 20-40 minutes while the tracer travels through your bloodstream.  Your heart activity will be monitored with an electrocardiogram (ECG).  You will lie down on an exam table.  Images of your heart will be taken for about 15-20 minutes.  You may also have a stress test. For this test, one of the following may be done: ? You will exercise on a treadmill or stationary bike. While you exercise, your heart's activity will be monitored with an ECG, and your blood pressure will be checked. ? You will be given medicines that will increase blood flow to parts of your heart. This is  done if you are unable to exercise.  When blood flow to your heart has peaked, a tracer will again be injected through the IV.  After 20-40 minutes, you will get back on the exam table and have more images taken of your heart.  Depending on the type of tracer used, scans may need to be repeated 3-4 hours later.  Your IV line will be removed when the procedure is over. The procedure may vary among health care providers and hospitals. What happens after the procedure?  Unless your health care provider tells you otherwise, you may return to your normal schedule, including diet, activities, and medicines.  Unless your health care provider tells you otherwise, you may increase  your fluid intake. This will help to flush the contrast dye from your body. Drink enough fluid to keep your urine pale yellow.  Ask your health care provider, or the department that is doing the test: ? When will my results be ready? ? How will I get my results? Summary  A cardiac nuclear scan measures the blood flow to the heart when a person is resting and when he or she is exercising.  Tell your health care provider if you are pregnant.  Before the procedure, ask your health care provider about changing or stopping your regular medicines. This is especially important if you are taking diabetes medicines or blood thinners.  After the procedure, unless your health care provider tells you otherwise, increase your fluid intake. This will help flush the contrast dye from your body.  After the procedure, unless your health care provider tells you otherwise, you may return to your normal schedule, including diet, activities, and medicines. This information is not intended to replace advice given to you by your health care provider. Make sure you discuss any questions you have with your health care provider. Document Revised: 12/30/2017 Document Reviewed: 12/30/2017 Elsevier Patient Education  2021 [employer].

## 2020-12-01 ENCOUNTER — Encounter: Payer: Self-pay | Admitting: Family

## 2020-12-12 ENCOUNTER — Ambulatory Visit
Admission: RE | Admit: 2020-12-12 | Discharge: 2020-12-12 | Disposition: A | Discharge status: Home / Self Care | Payer: Medicare Other | Source: Ambulatory Visit | Attending: Family | Admitting: Family

## 2020-12-12 ENCOUNTER — Other Ambulatory Visit: Payer: Self-pay

## 2020-12-12 ENCOUNTER — Telehealth: Payer: Self-pay | Admitting: Family

## 2020-12-12 DIAGNOSIS — I5022 Chronic systolic (congestive) heart failure: Secondary | ICD-10-CM

## 2020-12-12 DIAGNOSIS — R072 Precordial pain: Secondary | ICD-10-CM | POA: Diagnosis not present

## 2020-12-12 DIAGNOSIS — I209 Angina pectoris, unspecified: Secondary | ICD-10-CM

## 2020-12-12 DIAGNOSIS — I25118 Atherosclerotic heart disease of native coronary artery with other forms of angina pectoris: Secondary | ICD-10-CM

## 2020-12-12 LAB — NM MYOCAR MULTI W/SPECT W/WALL MOTION / EF
Estimated workload: 6.4 METS
Exercise duration (min): 4 min
Exercise duration (sec): 32 s
LV dias vol: 175 mL (ref 62–150)
LV sys vol: 114 mL
MPHR: 148 {beats}/min
Peak HR: 131 {beats}/min
Percent HR: 88 %
Rest HR: 57 {beats}/min
SDS: 11
SRS: 15
SSS: 19
TID: 1.23

## 2020-12-12 MED ORDER — TECHNETIUM TC 99M TETROFOSMIN IV KIT
30.0000 | PACK | Freq: Once | INTRAVENOUS | Status: AC | PRN
  Administered 2020-12-12: 32.4 via INTRAVENOUS

## 2020-12-12 MED ORDER — TECHNETIUM TC 99M TETROFOSMIN IV KIT
10.0000 | PACK | Freq: Once | INTRAVENOUS | Status: AC | PRN
  Administered 2020-12-12: 10.12 via INTRAVENOUS

## 2020-12-12 NOTE — Telephone Encounter (Signed)
Reviewed abnormal stress test, detailed below, with Donald Skinner. He reports continued exertional dyspnea and chest discomfort. Discussed indication for cardiac catheterization for further evaluation.   The risks [stroke (1 in 1000), death (1 in 1000), kidney failure [usually temporary] (1 in 500), bleeding (1 in 200), allergic reaction [possibly serious] (1 in 200)], benefits (diagnostic support and management of coronary artery disease) and alternatives of a cardiac catheterization were discussed in detail with Donald Skinner and he is willing to proceed.  Will route to nursing team for assistance in scheduling [facility] with Dr. Okey Dupre due to abnormal stress test, systolic heart failure, angina.   He had lab work including CBC, BMP on 11/22/20 and will not need pre-procedure blood work.  Donald Sorrow, NP  ______________  [name] 12/12/20 Blood pressure demonstrated a hypertensive response to exercise. EKG is not interpretable with stress due to baseline abnormalities. Defect 1: There is a large defect of severe severity present in the basal inferoseptal, basal inferior, basal inferolateral, mid inferoseptal, mid inferior, mid inferolateral and apical inferior location. Findings consistent with prior myocardial infarction with significant peri-infarct ischemia. This is a high risk study. Nuclear stress EF: 30%.

## 2020-12-13 NOTE — Telephone Encounter (Signed)
Called to discuss possible dates for the patients cardiac cath to be scheduled with Dr. Okey Dupre. lmtcb.

## 2020-12-13 NOTE — Telephone Encounter (Signed)
Second attempt to contact the patient. lmtcb.

## 2020-12-16 NOTE — Telephone Encounter (Signed)
Attempted to call pt to schedule cath procedure. No answer. Lmtcb. This is third attempt to reach pt.   Was reported to me that pt will be scheduled 5/31 with Dr. Okey Dupre.  Pt will need repeat labs as last were completed 11/22/20. Also to note last GFR 4/6 was 41.

## 2020-12-19 NOTE — Telephone Encounter (Signed)
4th attempt made to contact the patient. lmtcb.Will mail letter for the patient to contact the office.

## 2020-12-21 NOTE — Telephone Encounter (Signed)
Called Donald Skinner and he answered the phone. Asks me when his procedure is scheduled and discussed that we have been trying to get ahold of him to schedule. He is interested in pursuing cardiac catheterization and risks/benefits have been previously discussed.  Will discuss with [facility] nursing team to facilitate scheduling. I have asked him to remain by the phone this afternoon so we can hopefully get him scheduled. Also can discuss with his daughter per DPR who he tells me will drive him to the procedure.  Of note, he may have a conflict with his 01/18/21 office visit with Dr. Okey Dupre and I told him this could be rescheduled for 1-2 weeks after cardiac cath with Dr. Okey Dupre or APP.   Alver Sorrow, NP

## 2020-12-22 ENCOUNTER — Ambulatory Visit (INDEPENDENT_AMBULATORY_CARE_PROVIDER_SITE_OTHER): Payer: Medicare Other | Admitting: Family Medicine

## 2020-12-22 ENCOUNTER — Other Ambulatory Visit: Payer: Self-pay

## 2020-12-22 ENCOUNTER — Ambulatory Visit: Payer: Medicare Other | Admitting: Family Medicine

## 2020-12-22 ENCOUNTER — Other Ambulatory Visit: Payer: Self-pay | Admitting: Family

## 2020-12-22 VITALS — BP 122/70 | HR 65 | Temp 97.3°F | Ht 65.0 in | Wt 189.0 lb

## 2020-12-22 DIAGNOSIS — N183 Chronic kidney disease, stage 3 unspecified: Secondary | ICD-10-CM | POA: Diagnosis not present

## 2020-12-22 DIAGNOSIS — E1169 Type 2 diabetes mellitus with other specified complication: Secondary | ICD-10-CM

## 2020-12-22 DIAGNOSIS — I252 Old myocardial infarction: Secondary | ICD-10-CM

## 2020-12-22 DIAGNOSIS — I152 Hypertension secondary to endocrine disorders: Secondary | ICD-10-CM

## 2020-12-22 DIAGNOSIS — E1159 Type 2 diabetes mellitus with other circulatory complications: Secondary | ICD-10-CM

## 2020-12-22 DIAGNOSIS — E785 Hyperlipidemia, unspecified: Secondary | ICD-10-CM

## 2020-12-22 DIAGNOSIS — R9439 Abnormal result of other cardiovascular function study: Secondary | ICD-10-CM

## 2020-12-22 DIAGNOSIS — I6529 Occlusion and stenosis of unspecified carotid artery: Secondary | ICD-10-CM | POA: Diagnosis not present

## 2020-12-22 DIAGNOSIS — E1122 Type 2 diabetes mellitus with diabetic chronic kidney disease: Secondary | ICD-10-CM | POA: Diagnosis not present

## 2020-12-22 LAB — POCT GLYCOSYLATED HEMOGLOBIN (HGB A1C): Hemoglobin A1C: 7.2 % — AB (ref 4.0–5.6)

## 2020-12-22 MED ORDER — CLOPIDOGREL BISULFATE 75 MG PO TABS: ORAL_TABLET | ORAL | 3 refills | Status: AC

## 2020-12-22 MED ORDER — ATORVASTATIN CALCIUM 80 MG PO TABS: 80.0000 mg | ORAL_TABLET | Freq: Every day | ORAL | 3 refills | Status: DC

## 2020-12-22 MED ORDER — PIOGLITAZONE HCL 45 MG PO TABS
ORAL_TABLET | ORAL | 3 refills | Status: DC
Start: 2020-12-22 — End: 2021-10-16

## 2020-12-22 NOTE — Telephone Encounter (Signed)
Spoke to Donald Skinner. Scheduled L/RHC with Dr. Okey Dupre at [facility] first available on 01/06/21.  Notified Donald Skinner that we will need updated EKG and lab work.  Donald Skinner will have this done at the medical mall tomorrow 5/27.  Notified Donald Skinner depending on his lab results, he may need hydration prior to procedure so I will be calling him to verify his arrival time.  Will have arrive 4 hours prior d/t last GFR of 41, but will call if this changes. Donald Skinner voiced understanding.  Reviewed instructions below with Donald Skinner over the phone. Donald Skinner is going to pick up a copy of these instructions tomorrow after he has lab work and EKG at the [facility] Donald Skinner has no further questions at this time.   You are scheduled for a Cardiac Catheterization on Friday, June 10 with Dr. Cristal Deer End.  1. Please arrive at the Medical Mall at [facility] at 6:30 AM (This time is four hours before your procedure to ensure your preparation).     We will call you if this arrival time changes pending your lab results.   Free valet parking service is available.   Special note: Every effort is made to have your procedure done on time. Please understand that emergencies sometimes delay scheduled procedures.  2. Diet: Do not eat solid foods after midnight.  The patient may have clear liquids until 5am upon the day of the procedure.  3. Labs: You will need to have blood drawn and EKG completed on Friday 12/23/20 at the [facility] [place] at [facility].   4. Medication instructions in preparation for your procedure:   Contrast Allergy: No  Do Not take Hydrochlorothiazide (HCTZ) the morning of procedure.   Do Not take Metformin or Pioglitazone (Actos) morning of procedure.   Do not take Diabetes Med Glucophage (Metformin) on the day of the procedure and HOLD 48 HOURS AFTER THE PROCEDURE.  On the morning of your procedure, take your Aspirin and Plavix/Clopidogrel and any morning medicines NOT listed above.  You may use sips of water.  5. Plan for one night stay--bring personal  belongings. 6. Bring a current list of your medications and current insurance cards. 7. You MUST have a responsible person to drive you home. 8. Someone MUST be with you the first 24 hours after you arrive home or your discharge will be delayed. 9. Please wear clothes that are easy to get on and off and wear slip-on shoes.  Thank you for allowing [place] to care for you!   -- [facility]

## 2020-12-22 NOTE — Assessment & Plan Note (Signed)
Lab Results  Component Value Date   LDLCALC 63 09/13/2020   Cont atorvastatin 80 mg.

## 2020-12-22 NOTE — Telephone Encounter (Signed)
L/RHC orders placed with IVF [facility] prior to procedure.  Will reassess need for IVF prior to procedure based on lab work tomorrow.  Alver Sorrow, NP

## 2020-12-22 NOTE — Progress Notes (Signed)
Subjective:     Donald Skinner is a 70 y.o. male presenting for Follow-up (BP ) and Medication Refill     HPI   #HTN - did start HCTZ - no side effects - tolerating well - taking hctz, carvedilol and amlodipine - when checking his bp at night it is still high 160/91 - well controlled in the morning - takes medication in the morninging  Lab Results  Component Value Date   HGBA1C 7.2 (A) 12/22/2020    #Diabetes - doing OK with diet - metformin 500 mg twice daily and actos 45 - had been doing bad with diet prior to last visit - doing better with diet now - was drinking sweet tea  #CP - continues to occur - planning cath procedure with cardiology but has not been scheduled yet - slightly better  Review of Systems  11/30/2020: Cardiology - had not started HCTZ - recommended starting. Stress test for CAD which was positive. Planning CATH 11/22/2020: Clinic - start hctz. declined imdur, nitro if cp  Social History   Tobacco Use  Smoking Status Never Smoker  Smokeless Tobacco Never Used        Objective:    BP Readings from Last 3 Encounters:  12/22/20 122/70  11/30/20 124/84  11/22/20 (!) 160/80   Wt Readings from Last 3 Encounters:  12/22/20 189 lb (85.7 kg)  11/30/20 193 lb (87.5 kg)  11/22/20 193 lb (87.5 kg)    BP 122/70   Pulse 65   Temp (!) 97.3 F (36.3 C) (Temporal)   Ht 5\' 5"  (1.651 m)   Wt 189 lb (85.7 kg)   SpO2 96%   BMI 31.45 kg/m    Physical Exam Constitutional:      Appearance: Normal appearance. He is not ill-appearing or diaphoretic.  HENT:     Right Ear: External ear normal.     Left Ear: External ear normal.     Nose: Nose normal.  Eyes:     General: No scleral icterus.    Extraocular Movements: Extraocular movements intact.     Conjunctiva/sclera: Conjunctivae normal.  Cardiovascular:     Rate and Rhythm: Normal rate and regular rhythm.     Heart sounds: No murmur heard.   Pulmonary:     Effort: Pulmonary effort is  normal. No respiratory distress.     Breath sounds: Normal breath sounds. No wheezing.  Musculoskeletal:     Cervical back: Neck supple.  Skin:    General: Skin is warm and dry.  Neurological:     Mental Status: He is alert. Mental status is at baseline.  Psychiatric:        Mood and Affect: Mood normal.           Assessment & Plan:   Problem List Items Addressed This Visit      Cardiovascular and Mediastinum   Hypertension associated with diabetes (HCC)    BP controlled in the office, though notes elevated in the evening. Will try moving amlodipine 10 mg to evening. Cont HCTZ 25 mg and carvedilol 25 mg.       Relevant Medications   pioglitazone (ACTOS) 45 MG tablet   atorvastatin (LIPITOR) 80 MG tablet   Carotid atherosclerosis    Lifelong plavix 75 mg and lipitor. Seeing cardiology and planning Cath due to positive stress test recently. Cont prn nitro      Relevant Medications   atorvastatin (LIPITOR) 80 MG tablet   clopidogrel (PLAVIX) 75 MG tablet  Endocrine   Type 2 diabetes mellitus with diabetic chronic kidney disease (HCC)    Lab Results  Component Value Date   HGBA1C 7.2 (A) 12/22/2020   Improved with better diet. Cont actos 45 mg and metformin 500 mg bid      Relevant Medications   pioglitazone (ACTOS) 45 MG tablet   atorvastatin (LIPITOR) 80 MG tablet   Other Relevant Orders   POCT glycosylated hemoglobin (Hb A1C) (Completed)   Hyperlipidemia associated with type 2 diabetes mellitus (HCC) - Primary    Lab Results  Component Value Date   LDLCALC 63 09/13/2020   Cont atorvastatin 80 mg.       Relevant Medications   pioglitazone (ACTOS) 45 MG tablet   atorvastatin (LIPITOR) 80 MG tablet     Other   History of MI (myocardial infarction)   Relevant Medications   clopidogrel (PLAVIX) 75 MG tablet       Return in about 3 months (around 03/24/2021) for Diabetes and blood pressure.  Lynnda Child, MD  This visit occurred during the  SARS-CoV-2 public health emergency.  Safety protocols were in place, including screening questions prior to the visit, additional usage of staff PPE, and extensive cleaning of exam room while observing appropriate contact time as indicated for disinfecting solutions.

## 2020-12-22 NOTE — Addendum Note (Signed)
Addended by: Lanny Hurst on: 12/22/2020 03:48 PM   Modules accepted: Orders

## 2020-12-22 NOTE — Patient Instructions (Addendum)
#  High blood pressure - Try taking Amlodipine at nighttime to see if this improves blood pressure control - no changes today - Call or mychart in 2 weeks with your home readings  #Diabetes  - This has improved to 7.2 - continue to follow healthy diet - no change in medications

## 2020-12-22 NOTE — Assessment & Plan Note (Signed)
Lifelong plavix 75 mg and lipitor. Seeing cardiology and planning Cath due to positive stress test recently. Cont prn nitro

## 2020-12-22 NOTE — Assessment & Plan Note (Signed)
BP controlled in the office, though notes elevated in the evening. Will try moving amlodipine 10 mg to evening. Cont HCTZ 25 mg and carvedilol 25 mg.

## 2020-12-22 NOTE — Assessment & Plan Note (Signed)
Lab Results  Component Value Date   HGBA1C 7.2 (A) 12/22/2020   Improved with better diet. Cont actos 45 mg and metformin 500 mg bid

## 2020-12-23 ENCOUNTER — Other Ambulatory Visit
Admission: RE | Admit: 2020-12-23 | Discharge: 2020-12-23 | Disposition: A | Discharge status: Home / Self Care | Payer: Medicare Other | Attending: Family | Admitting: Family

## 2020-12-23 ENCOUNTER — Encounter: Payer: Self-pay | Admitting: Family Medicine

## 2020-12-23 DIAGNOSIS — I25118 Atherosclerotic heart disease of native coronary artery with other forms of angina pectoris: Secondary | ICD-10-CM | POA: Diagnosis present

## 2020-12-23 DIAGNOSIS — I209 Angina pectoris, unspecified: Secondary | ICD-10-CM

## 2020-12-23 DIAGNOSIS — I5022 Chronic systolic (congestive) heart failure: Secondary | ICD-10-CM

## 2020-12-23 LAB — BASIC METABOLIC PANEL
Anion gap: 8 (ref 5–15)
BUN: 22 mg/dL (ref 8–23)
CO2: 25 mmol/L (ref 22–32)
Calcium: 9.3 mg/dL (ref 8.9–10.3)
Chloride: 104 mmol/L (ref 98–111)
Creatinine, Ser: 1.64 mg/dL — ABNORMAL HIGH (ref 0.61–1.24)
GFR, Estimated: 44 mL/min — ABNORMAL LOW (ref >60)
Glucose, Bld: 222 mg/dL — ABNORMAL HIGH (ref 70–99)
Potassium: 3.9 mmol/L (ref 3.5–5.1)
Sodium: 137 mmol/L (ref 135–145)

## 2020-12-23 LAB — CBC
HCT: 43.5 % (ref 39.0–52.0)
Hemoglobin: 14.1 g/dL (ref 13.0–17.0)
MCH: 28.4 pg (ref 26.0–34.0)
MCHC: 32.4 g/dL (ref 30.0–36.0)
MCV: 87.7 fL (ref 80.0–100.0)
Platelets: 146 10*3/uL — ABNORMAL LOW (ref 150–400)
RBC: 4.96 MIL/uL (ref 4.22–5.81)
RDW: 14.3 % (ref 11.5–15.5)
WBC: 4.6 10*3/uL (ref 4.0–10.5)
nRBC: 0 % (ref 0.0–0.2)

## 2020-12-23 NOTE — Telephone Encounter (Signed)
Spoke to pt to confirm that his arrival time will be at 6:30AM.  Pt did not pick up instructions today at front desk.  Notified pt I will put cath instructions on MyChart and pt states his daughter will help him view these.  Pt will have daughter call with any questions.

## 2020-12-27 ENCOUNTER — Encounter: Payer: Self-pay | Admitting: Family Medicine

## 2020-12-27 DIAGNOSIS — H35039 Hypertensive retinopathy, unspecified eye: Secondary | ICD-10-CM | POA: Insufficient documentation

## 2020-12-28 ENCOUNTER — Telehealth: Payer: Self-pay | Admitting: Family

## 2020-12-28 NOTE — Telephone Encounter (Signed)
Patients daughter calling to discuss patients upcoming cath

## 2020-12-28 NOTE — Telephone Encounter (Signed)
Spoke to pt. Made aware of appt tomorrow at [facility] arrive at 1:45PM to [facility] for EKG.  Pt voiced understanding and states he will come to our office after EKG to pick up written cath instructions.  Instructions left at front desk.  Pt has no further questions.

## 2020-12-28 NOTE — Telephone Encounter (Signed)
Spoke to pt's daughter and reviewed cath instructions. No further questions at this time.  See telephone note 12/12/20 for cath instructions.   Called pt and notified that I have scheduled his EKG to be done at the [facility] tomorrow 12/29/20 at [facility] and to please arrive at 1:45 PM and check in at the registration desk. Left detailed message with this information on pt's vm (DPR approved). Also asked pt to call me back to verify that he received info re appt.   Also notified pt on vm that he may pick up printed instructions at our front desk when he comes tomorrow for EKG.   Will try to reach pt later today if have not received call back.

## 2020-12-29 ENCOUNTER — Other Ambulatory Visit: Payer: Self-pay

## 2020-12-29 ENCOUNTER — Ambulatory Visit
Admission: RE | Admit: 2020-12-29 | Discharge: 2020-12-29 | Disposition: A | Discharge status: Home / Self Care | Payer: Medicare Other | Source: Ambulatory Visit | Attending: Family | Admitting: Family

## 2020-12-29 DIAGNOSIS — Z01810 Encounter for preprocedural cardiovascular examination: Secondary | ICD-10-CM | POA: Diagnosis not present

## 2020-12-29 DIAGNOSIS — I25118 Atherosclerotic heart disease of native coronary artery with other forms of angina pectoris: Secondary | ICD-10-CM | POA: Insufficient documentation

## 2020-12-29 DIAGNOSIS — I5022 Chronic systolic (congestive) heart failure: Secondary | ICD-10-CM | POA: Insufficient documentation

## 2020-12-29 DIAGNOSIS — R001 Bradycardia, unspecified: Secondary | ICD-10-CM | POA: Insufficient documentation

## 2021-01-06 ENCOUNTER — Telehealth: Payer: Self-pay | Admitting: *Deleted

## 2021-01-06 ENCOUNTER — Ambulatory Visit
Admission: RE | Admit: 2021-01-06 | Discharge: 2021-01-06 | Disposition: A | Discharge status: Home / Self Care | Payer: Medicare Other | Attending: Internal Medicine | Admitting: Internal Medicine

## 2021-01-06 ENCOUNTER — Encounter
Admission: RE | Disposition: A | Discharge status: Home / Self Care | Payer: Self-pay | Source: Home / Self Care | Attending: Internal Medicine

## 2021-01-06 ENCOUNTER — Encounter: Payer: Self-pay | Admitting: Internal Medicine

## 2021-01-06 ENCOUNTER — Other Ambulatory Visit: Payer: Self-pay

## 2021-01-06 DIAGNOSIS — Z888 Allergy status to other drugs, medicaments and biological substances status: Secondary | ICD-10-CM | POA: Insufficient documentation

## 2021-01-06 DIAGNOSIS — E1122 Type 2 diabetes mellitus with diabetic chronic kidney disease: Secondary | ICD-10-CM | POA: Diagnosis not present

## 2021-01-06 DIAGNOSIS — Z955 Presence of coronary angioplasty implant and graft: Secondary | ICD-10-CM | POA: Insufficient documentation

## 2021-01-06 DIAGNOSIS — E785 Hyperlipidemia, unspecified: Secondary | ICD-10-CM | POA: Insufficient documentation

## 2021-01-06 DIAGNOSIS — N183 Chronic kidney disease, stage 3 unspecified: Secondary | ICD-10-CM | POA: Diagnosis not present

## 2021-01-06 DIAGNOSIS — I130 Hypertensive heart and chronic kidney disease with heart failure and stage 1 through stage 4 chronic kidney disease, or unspecified chronic kidney disease: Secondary | ICD-10-CM | POA: Diagnosis not present

## 2021-01-06 DIAGNOSIS — I255 Ischemic cardiomyopathy: Secondary | ICD-10-CM | POA: Diagnosis not present

## 2021-01-06 DIAGNOSIS — Z7982 Long term (current) use of aspirin: Secondary | ICD-10-CM | POA: Insufficient documentation

## 2021-01-06 DIAGNOSIS — Z7902 Long term (current) use of antithrombotics/antiplatelets: Secondary | ICD-10-CM | POA: Diagnosis not present

## 2021-01-06 DIAGNOSIS — R9439 Abnormal result of other cardiovascular function study: Secondary | ICD-10-CM

## 2021-01-06 DIAGNOSIS — Z7984 Long term (current) use of oral hypoglycemic drugs: Secondary | ICD-10-CM | POA: Insufficient documentation

## 2021-01-06 DIAGNOSIS — I25118 Atherosclerotic heart disease of native coronary artery with other forms of angina pectoris: Secondary | ICD-10-CM | POA: Diagnosis not present

## 2021-01-06 DIAGNOSIS — I5022 Chronic systolic (congestive) heart failure: Secondary | ICD-10-CM

## 2021-01-06 DIAGNOSIS — Z79899 Other long term (current) drug therapy: Secondary | ICD-10-CM | POA: Insufficient documentation

## 2021-01-06 DIAGNOSIS — N189 Chronic kidney disease, unspecified: Secondary | ICD-10-CM

## 2021-01-06 DIAGNOSIS — I200 Unstable angina: Secondary | ICD-10-CM

## 2021-01-06 HISTORY — PX: RIGHT/LEFT HEART CATH AND CORONARY ANGIOGRAPHY: CATH118266

## 2021-01-06 LAB — GLUCOSE, CAPILLARY: Glucose-Capillary: 152 mg/dL — ABNORMAL HIGH (ref 70–99)

## 2021-01-06 SURGERY — RIGHT/LEFT HEART CATH AND CORONARY ANGIOGRAPHY
Anesthesia: Moderate Sedation

## 2021-01-06 MED ORDER — MIDAZOLAM HCL 2 MG/2ML IJ SOLN
INTRAMUSCULAR | Status: DC | PRN
  Administered 2021-01-06: 1 mg via INTRAVENOUS

## 2021-01-06 MED ORDER — SODIUM CHLORIDE 0.9 % IV SOLN: 250.0000 mL | INTRAVENOUS | Status: DC | PRN

## 2021-01-06 MED ORDER — VERAPAMIL HCL 2.5 MG/ML IV SOLN
INTRAVENOUS | Status: AC
  Filled 2021-01-06: qty 2

## 2021-01-06 MED ORDER — ASPIRIN 81 MG PO CHEW
CHEWABLE_TABLET | ORAL | Status: AC
  Filled 2021-01-06: qty 1

## 2021-01-06 MED ORDER — HEPARIN (PORCINE) IN NACL 2000-0.9 UNIT/L-% IV SOLN
INTRAVENOUS | Status: DC | PRN
  Administered 2021-01-06: 1000 mL

## 2021-01-06 MED ORDER — SODIUM CHLORIDE 0.9 % IV SOLN: INTRAVENOUS | Status: AC

## 2021-01-06 MED ORDER — IOHEXOL 300 MG/ML  SOLN
INTRAMUSCULAR | Status: DC | PRN
  Administered 2021-01-06: 29 mL

## 2021-01-06 MED ORDER — HEPARIN SODIUM (PORCINE) 1000 UNIT/ML IJ SOLN
INTRAMUSCULAR | Status: AC
  Filled 2021-01-06: qty 1

## 2021-01-06 MED ORDER — FENTANYL CITRATE (PF) 100 MCG/2ML IJ SOLN
INTRAMUSCULAR | Status: DC | PRN
  Administered 2021-01-06: 25 ug via INTRAVENOUS

## 2021-01-06 MED ORDER — ASPIRIN 81 MG PO CHEW: 81.0000 mg | CHEWABLE_TABLET | ORAL | Status: AC

## 2021-01-06 MED ORDER — HEPARIN (PORCINE) IN NACL 1000-0.9 UT/500ML-% IV SOLN
INTRAVENOUS | Status: AC
  Filled 2021-01-06: qty 1000

## 2021-01-06 MED ORDER — HEPARIN SODIUM (PORCINE) 1000 UNIT/ML IJ SOLN
INTRAMUSCULAR | Status: DC | PRN
  Administered 2021-01-06: 4500 [IU] via INTRAVENOUS

## 2021-01-06 MED ORDER — VERAPAMIL HCL 2.5 MG/ML IV SOLN
INTRAVENOUS | Status: DC | PRN
  Administered 2021-01-06: 2.5 mg via INTRA_ARTERIAL

## 2021-01-06 MED ORDER — ASPIRIN 81 MG PO CHEW
CHEWABLE_TABLET | ORAL | Status: AC
  Administered 2021-01-06: 81 mg via ORAL
  Filled 2021-01-06: qty 1

## 2021-01-06 MED ORDER — FENTANYL CITRATE (PF) 100 MCG/2ML IJ SOLN
INTRAMUSCULAR | Status: AC
  Filled 2021-01-06: qty 2

## 2021-01-06 MED ORDER — ISOSORBIDE MONONITRATE ER 30 MG PO TB24: 30.0000 mg | ORAL_TABLET | Freq: Every day | ORAL | 5 refills | Status: DC

## 2021-01-06 MED ORDER — SODIUM CHLORIDE 0.9% FLUSH
3.0000 mL | Freq: Two times a day (BID) | INTRAVENOUS | Status: DC
Start: 2021-01-06 — End: 2021-01-06

## 2021-01-06 MED ORDER — MIDAZOLAM HCL 2 MG/2ML IJ SOLN
INTRAMUSCULAR | Status: AC
  Filled 2021-01-06: qty 2

## 2021-01-06 MED ORDER — SODIUM CHLORIDE 0.9 % IV SOLN: INTRAVENOUS | Status: DC

## 2021-01-06 SURGICAL SUPPLY — 13 items
CATH 5F 110X4 TIG (CATHETERS) ×2 IMPLANT
CATH BALLN WEDGE 5F 110CM (CATHETERS) ×2 IMPLANT
CATH INFINITI 5 FR JL3.5 (CATHETERS) ×2 IMPLANT
DEVICE RAD TR BAND REGULAR (VASCULAR PRODUCTS) ×2 IMPLANT
DRAPE BRACHIAL (DRAPES) ×4 IMPLANT
GLIDESHEATH SLEND SS 6F .021 (SHEATH) ×2 IMPLANT
GUIDEWIRE INQWIRE 1.5J.035X260 (WIRE) ×1 IMPLANT
INQWIRE 1.5J .035X260CM (WIRE) ×2
PACK CARDIAC CATH (CUSTOM PROCEDURE TRAY) ×2 IMPLANT
PROTECTION STATION PRESSURIZED (MISCELLANEOUS) ×2
SET ATX SIMPLICITY (MISCELLANEOUS) ×2 IMPLANT
SHEATH GLIDE SLENDER 4/5FR (SHEATH) ×2 IMPLANT
STATION PROTECTION PRESSURIZED (MISCELLANEOUS) ×1 IMPLANT

## 2021-01-06 NOTE — Telephone Encounter (Signed)
Attempted to call pt and pt's daughter. No answer on either phone. Lmtcb.  Per DPR ok to leave detailed message.  Left message asking pt to have lab work (BMP) at the medical mall on Monday 6/13.  Advised pt that since it is Friday at 4PM, I have placed the orders and he may arrive any time Monday between 7AM and 6PM and check in at the registration desk to have labs completed.  Asked pt to call me back if he receives vm prior to 5PM today.  Otherwise, will follow up Monday morning to reach out to pt again.   Will also forward to scheduling to have f/u s/p cath scheduled with Dr. Okey Dupre or APP in 1-2 weeks.  Pt may also keep f/u scheduled in August per Dr. Okey Dupre.

## 2021-01-06 NOTE — Progress Notes (Signed)
Dr. End at bedside. 

## 2021-01-06 NOTE — Interval H&P Note (Signed)
History and Physical Interval Note:  01/06/2021 9:49 AM  Donald Skinner  has presented today for surgery, with the diagnosis of stable angina, chronic HFrEF, and abnormal stress test.  The various methods of treatment have been discussed with the patient and family. After consideration of risks, benefits and other options for treatment, the patient has consented to  Procedure(s): RIGHT/LEFT HEART CATH AND CORONARY ANGIOGRAPHY (N/A) as a surgical intervention.  The patient's history has been reviewed, patient examined, no change in status, stable for surgery.  I have reviewed the patient's chart and labs.  Questions were answered to the patient's satisfaction.  Cath Lab Visit (complete for each Cath Lab visit)  Clinical Evaluation Leading to the Procedure:   ACS: No.  Non-ACS:    Anginal Classification: CCS II  Anti-ischemic medical therapy: Maximal Therapy (2 or more classes of medications)  Non-Invasive Test Results: High-risk stress test findings: cardiac mortality >3%/year  Prior CABG: No previous CABG  Donald Skinner

## 2021-01-06 NOTE — Telephone Encounter (Signed)
-----   Message from Yvonne Kendall, MD sent at 01/06/2021  3:11 PM EDT ----- Regarding: Post-cath follow-up Hello,  [name] is s/p cath today.  Could he have a BMP on Monday to reassess his CKD and see me or an APP for follow-up in 1-2 weeks?  He can also keep his appointment that is already scheduled with me in August.  Thanks.  Yvonne Kendall, MD [facility]

## 2021-01-06 NOTE — H&P (Signed)
Cardiology Admission History and Physical:   Patient ID: Donald Skinner MRN: [id number]; DOB: July 02, 1951   Admission date: 01/06/2021  PCP:  Lesleigh Noe, MD   [facility] Providers Cardiologist:  Nelva Bush, MD   Chief Complaint: Chest pain and elevated blood pressure  Patient Profile:   Donald Skinner is a 70 y.o. 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  History of Present Illness:   Donald Skinner was evaluated in our office last month by Laurann Montana, and PE.  At that time, he complained of rising blood pressures as well as new exertional dyspnea.  Today, I he feels like his exertional shortness of breath has been stable for years.  He notes consistent chest pressure radiating to his back whenever he walks for an extended period.  He tries to walk on a treadmill 30 minutes a day and frequently walks through the pain.  He and his daughter wonder if his worsening blood pressure could be a manifestation of his heart disease.  Donald Skinner was previously followed in [place], having last undergone catheterization there in 2018.  He was noted to have multivessel CAD with severe diffuse disease involving the distal segments as well as a chronic total occlusion of the proximal RCA.  Disease was not felt to be amenable to PCI or CABG.  Due to his aforementioned symptoms, he was referred for myocardial perfusion stress test by Ms. Walker last month.  This showed a large area of predominantly inferior and apical scar and an LVEF of 30%.   Past Medical History:  Diagnosis Date   Allergy    Diabetes mellitus without complication ([place])    Heart disease    Hyperlipidemia    Hypertension    Myocardial  infarction [facility])     Past Surgical History:  Procedure Laterality Date   Cardiac stents     x 6 stents- had 5 heart attacks     Medications Prior to Admission: Prior to Admission medications   Medication Sig Start Date Brok Stocking Date Taking? Authorizing Provider  albuterol (VENTOLIN HFA) 108 (90 Base) MCG/ACT inhaler Inhale 2 puffs into the lungs every 6 (six) hours as needed for wheezing or shortness of breath. 09/13/20  Yes Lesleigh Noe, MD  amLODipine (NORVASC) 10 MG tablet Take 1 tablet (10 mg total) by mouth daily. 10/12/20  Yes Lesleigh Noe, MD  aspirin EC 81 MG tablet Take 81 mg by mouth daily. Swallow whole.   Yes [provider]  bismuth subsalicylate (PEPTO BISMOL) 262 MG/15ML suspension Take 30 mLs by mouth every 6 (six) hours as needed for indigestion or diarrhea or loose stools.   Yes [provider]  carvedilol (COREG) 25 MG tablet TAKE 1 TABLET(25 MG) BY MOUTH TWICE DAILY WITH A MEAL Patient taking differently: Take 25 mg by mouth 2 (two) times daily with a meal. 11/14/20  Yes Lesleigh Noe, MD  clopidogrel (PLAVIX) 75 MG tablet TAKE 1 TABLET(75 MG) BY MOUTH DAILY Patient taking differently: Take 75 mg by mouth daily. 12/22/20  Yes Lesleigh Noe, MD  hydrochlorothiazide (HYDRODIURIL) 25 MG tablet Take 1 tablet (25 mg total) by mouth daily. 11/22/20  Yes Lesleigh Noe, MD  metFORMIN (GLUCOPHAGE) 500 MG tablet Take 1 tablet (  500 mg total) by mouth 2 (two) times daily with a meal. 10/12/20  Yes Lesleigh Noe, MD  nitroGLYCERIN (NITROSTAT) 0.4 MG SL tablet Place 0.4 mg under the tongue every 5 (five) minutes as needed for chest pain.   Yes [provider]  pioglitazone (ACTOS) 45 MG tablet TAKE 1 TABLET(45 MG) BY MOUTH DAILY Patient taking differently: Take 45 mg by mouth daily. 12/22/20  Yes Lesleigh Noe, MD  VITAMIN D PO Take 1 capsule by mouth daily.   Yes [provider]  atorvastatin (LIPITOR) 80 MG tablet Take 1 tablet (80 mg total)  by mouth daily. Patient not taking: No sig reported 12/22/20   Lesleigh Noe, MD  blood glucose meter kit and supplies Dispense based on patient and insurance preference. Use up to four times daily as directed. (FOR ICD-10 E10.9, E11.9). 03/04/20   Lesleigh Noe, MD     Allergies:    Allergies  Allergen Reactions   Lipitor [Atorvastatin]     myalgia   Losartan Potassium Swelling    Tongue swelling   Lisinopril Cough   Rosuvastatin Other (See Comments)    myalgias   Simvastatin Other (See Comments)    weakness    Social History:   Social History   Tobacco Use   Smoking status: Never   Smokeless tobacco: Never  Vaping Use   Vaping Use: Never used  Substance Use Topics   Alcohol use: Not Currently   Drug use: Never      Family History:   The patient's family history includes Alcohol abuse in his brother, brother, father, and mother; Arthritis in his mother; Drug abuse in his brother; Heart attack (age of onset: 75) in his father; Hypertension in his father; Mental illness in his brother.    ROS:  Please see the history of present illness. All other ROS reviewed and negative.     Physical Exam/Data:   Vitals:   01/06/21 0723  BP: (!) 183/93  Pulse: 64  Temp: 98.1 F (36.7 C)  TempSrc: Oral  SpO2: 99%  Weight: 88.9 kg  Height: 5' 5"  (1.651 m)   No intake or output data in the 24 hours ending 01/06/21 0940 Last 3 Weights 01/06/2021 12/22/2020 11/30/2020  Weight (lbs) 196 lb 189 lb 193 lb  Weight (kg) 88.905 kg 85.73 kg 87.544 kg     Body mass index is 32.62 kg/m.  General:  Well nourished, well developed, in no acute distress HEENT: normal Lymph: no adenopathy Neck: no JVD Endocrine:  No thryomegaly Vascular: No carotid bruits; FA pulses 2+ bilaterally without bruits  Cardiac:  normal S1, S2; RRR; no murmurs, rubs, or gallops Lungs:  clear to auscultation bilaterally, no wheezing, rhonchi or rales  Abd: soft, nontender, no hepatomegaly  Ext: no lower  extremity edema Musculoskeletal:  No deformities, BUE and BLE strength normal and equal Skin: warm and dry  Neuro:  CNs 2-12 intact, no focal abnormalities noted Psych:  Normal affect    EKG:  The ECG that was done 12/29/2020 was personally reviewed and demonstrates Sinus bradycardia, inferior MI, and nonspecific T wave changes.  Relevant CV Studies: Pharmacologic MPI (12/12/2020): High risk study with large fixed inferior, inferoseptal, and inferolateral defect consistent with scar and peri-infarct ischemia.  LVEF 30%.  Laboratory Data:  High Sensitivity Troponin:  No results for input(s): TROPONINIHS in the last 720 hours.    ChemistryNo results for input(s): NA, K, CL, CO2, GLUCOSE, BUN, CREATININE, CALCIUM, GFRNONAA, GFRAA, ANIONGAP  in the last 168 hours.  No results for input(s): PROT, ALBUMIN, AST, ALT, ALKPHOS, BILITOT in the last 168 hours. HematologyNo results for input(s): WBC, RBC, HGB, HCT, MCV, MCH, MCHC, RDW, PLT in the last 168 hours. BNPNo results for input(s): BNP, PROBNP in the last 168 hours.  DDimer No results for input(s): DDIMER in the last 168 hours.   Radiology/Studies:  No results found.   Assessment and Plan:   Stable angina and chronic HFrEF: Patient with long history of exertional dyspnea and chest discomfort.  Symptoms may have progressed a little bit over the last few months in the setting of uncontrolled hypertension.  Recent stress test was high risk showing predominantly inferior scar consistent with outside catheterization report in 2018 indicating CTO of the RCA and diffuse distal LAD and LCx disease.  He has been referred for right and left heart catheterization and possible PCI, which is reasonable in the setting of his known CAD, severely reduced LVEF by stress test, and continued symptoms.  I have reviewed the risks, indications, and alternatives to cardiac catheterization, possible angioplasty, and stenting with the patient. Risks include but are not  limited to bleeding, infection, vascular injury, stroke, myocardial infection, arrhythmia, kidney injury, radiation-related injury in the case of prolonged fluoroscopy use, emergency cardiac surgery, and death. The patient understands the risks of serious complication is 1-2 in 3685 with diagnostic cardiac cath and 1-2% or less with angioplasty/stenting.  Medication changes for optimization of his hypertension to be made following catheterization.  Uncontrolled hypertension: Medication changes to be determined following catheterization.  Consider renal artery Doppler to exclude renal artery stenosis as an outpatient.   For questions or updates, please contact [place] Please consult [email or web address] for contact info under [facility].   Signed, Nelva Bush, MD  01/06/2021 9:40 AM

## 2021-01-09 ENCOUNTER — Other Ambulatory Visit
Admission: RE | Admit: 2021-01-09 | Discharge: 2021-01-09 | Disposition: A | Discharge status: Home / Self Care | Payer: Medicare Other | Source: Ambulatory Visit | Attending: Internal Medicine | Admitting: Internal Medicine

## 2021-01-09 ENCOUNTER — Encounter: Payer: Self-pay | Admitting: Internal Medicine

## 2021-01-09 DIAGNOSIS — N189 Chronic kidney disease, unspecified: Secondary | ICD-10-CM | POA: Diagnosis present

## 2021-01-09 LAB — BASIC METABOLIC PANEL
Anion gap: 7 (ref 5–15)
BUN: 24 mg/dL — ABNORMAL HIGH (ref 8–23)
CO2: 26 mmol/L (ref 22–32)
Calcium: 9.1 mg/dL (ref 8.9–10.3)
Chloride: 106 mmol/L (ref 98–111)
Creatinine, Ser: 1.55 mg/dL — ABNORMAL HIGH (ref 0.61–1.24)
GFR, Estimated: 47 mL/min — ABNORMAL LOW (ref >60)
Glucose, Bld: 190 mg/dL — ABNORMAL HIGH (ref 70–99)
Potassium: 4 mmol/L (ref 3.5–5.1)
Sodium: 139 mmol/L (ref 135–145)

## 2021-01-09 NOTE — Telephone Encounter (Signed)
Attempted to schedule.  

## 2021-01-09 NOTE — Telephone Encounter (Signed)
Spoke to pt, he confirms he will have BMET today at the medical mall.  Orders in place.   Pt aware he will receive call to schedule 1-2 week follow up post cath.

## 2021-01-10 ENCOUNTER — Telehealth: Payer: Self-pay | Admitting: *Deleted

## 2021-01-10 NOTE — Telephone Encounter (Signed)
-----   Message from Yvonne Kendall, MD sent at 01/09/2021 10:08 PM EDT ----- Please let Donald Skinner know that his kidney function is stable and that it is okay for him to restart his prior metformin dose (500 mg BID).  He should follow-up as scheduled.

## 2021-01-10 NOTE — Telephone Encounter (Signed)
Attempted to call pt to notify results and provider's recc below.  No answer. Lmtcb.   Spoke to pt's daughter, Donald Skinner (DPR approved), notified of lab results and that pt may restart prior Metformin dose (500mg  BID) and follow up as scheduled.   Dtr voiced understanding and will notify pt.  Pt will follow up 03/08/21.

## 2021-01-12 NOTE — Telephone Encounter (Signed)
Pt has been scheduled for f/u tomorrow 01/13/21.

## 2021-01-13 ENCOUNTER — Encounter: Payer: Self-pay | Admitting: Physician Assistant

## 2021-01-13 ENCOUNTER — Other Ambulatory Visit: Payer: Self-pay

## 2021-01-13 ENCOUNTER — Ambulatory Visit (INDEPENDENT_AMBULATORY_CARE_PROVIDER_SITE_OTHER): Payer: Medicare Other | Admitting: Physician Assistant

## 2021-01-13 VITALS — BP 138/70 | HR 67 | Ht 65.0 in | Wt 187.6 lb

## 2021-01-13 DIAGNOSIS — E785 Hyperlipidemia, unspecified: Secondary | ICD-10-CM

## 2021-01-13 DIAGNOSIS — I25118 Atherosclerotic heart disease of native coronary artery with other forms of angina pectoris: Secondary | ICD-10-CM

## 2021-01-13 DIAGNOSIS — Z8673 Personal history of transient ischemic attack (TIA), and cerebral infarction without residual deficits: Secondary | ICD-10-CM

## 2021-01-13 DIAGNOSIS — Z79899 Other long term (current) drug therapy: Secondary | ICD-10-CM

## 2021-01-13 DIAGNOSIS — I5022 Chronic systolic (congestive) heart failure: Secondary | ICD-10-CM

## 2021-01-13 DIAGNOSIS — I252 Old myocardial infarction: Secondary | ICD-10-CM

## 2021-01-13 DIAGNOSIS — I10 Essential (primary) hypertension: Secondary | ICD-10-CM

## 2021-01-13 DIAGNOSIS — N189 Chronic kidney disease, unspecified: Secondary | ICD-10-CM | POA: Diagnosis not present

## 2021-01-13 DIAGNOSIS — Z789 Other specified health status: Secondary | ICD-10-CM

## 2021-01-13 MED ORDER — DAPAGLIFLOZIN PROPANEDIOL 5 MG PO TABS: 5.0000 mg | ORAL_TABLET | Freq: Every day | ORAL | 3 refills | Status: DC

## 2021-01-13 NOTE — Progress Notes (Signed)
Office Visit    Patient Name: Donald Skinner Date of Encounter: 01/13/2021  PCP:  Lesleigh Noe, [place]  Cardiologist:  Nelva Bush, MD  Advanced Practice Provider:  No care team member to display Electrophysiologist:  None :[phone]}   Chief Complaint    Chief Complaint  Patient presents with   Follow-up    S/p Rt and Lt Heart Cath without Intervention. Medical therapy recommended.  Pt states eye doctor is requesting results of stress test and cath report. States he is doing well.    70 y.o. male with history of CAD s/p recent right and left heart catheterization, ischemic cardiomyopathy with LVEF 35 to 40% by echo 07/2016, hypertension, DM2, and here today for follow-up after catheterization.  Past Medical History    Past Medical History:  Diagnosis Date   Allergy    Diabetes mellitus without complication ([place])    Heart disease    Hyperlipidemia    Hypertension    Myocardial infarction [facility])    Past Surgical History:  Procedure Laterality Date   Cardiac stents     x 6 stents- had 5 heart attacks   RIGHT/LEFT HEART CATH AND CORONARY ANGIOGRAPHY N/A 01/06/2021   Procedure: RIGHT/LEFT HEART CATH AND CORONARY ANGIOGRAPHY;  Surgeon: Nelva Bush, MD;  Location: [facility];  Service: Cardiovascular;  Laterality: N/A;    Allergies  Allergies  Allergen Reactions   Lipitor [Atorvastatin]     myalgia   Losartan Potassium Swelling    Tongue swelling   Lisinopril Cough   Rosuvastatin Other (See Comments)    myalgias   Simvastatin Other (See Comments)    weakness    History of Present Illness    Donald Skinner is a 70 y.o. male with PMH as above.  He has history of CAD s/p recent right and left heart catheterization, ICM with LVEF 35 to 40% by echo 07/2016, hypertension, and DM2.  Initial PCI to LAD in 2003.  Subsequent PCI to proximal LAD and mid left circumflex 06/2005.  In 02/2010, he underwent PCI to proximal LAD  for ISR (he declined single-vessel CABG).  He had PCI to proximal left circumflex 08/2012 in the setting of non-STEMI.  Later that year 12/2012, he underwent PCI to proximal right coronary artery in the setting of STEMI.  07/2016 catheterization in the setting of non-STEMI showed severe multivessel CAD not amenable to PCI or CABG.  Echo at that time showed LVEF 35 to 40%, moderate MR, normal diastolic function, normal PA pressure.    He was seen by his PCP 11/22/2020 and started on HCTZ for hypertension.  He noted a 24-hour history of TIA-like symptoms with paresthesias and heaviness, subsequently self resolving.  He was recommended for neurology referral.  He was seen 11/30/2020 by Laurann Montana, NP and reported his blood pressure was still elevated and greater than 130/80 at home, though he had not yet picked up his HCTZ.  He reported new dyspnea on exertion over the last month.  He reported it as likely due to his TIA episodes.  He was previously walking regularly for exercise.  He also noted chest tightness with exertion, as well as dyspnea.  Stress test was recommended at that time.  Subsequent stress test was abnormal with recommendation to proceed with cardiac catheterization.  Specifically, BP demonstrated a hypertensive response to exercise.  There was a large defect of severe severity as outlined below under CV studies.  Findings were  thought consistent with prior MI with significant peri-infarct ischemia.  Overall, it was ruled a high risk study.  EF 30%.  He underwent 01/06/2021 catheterization that showed severe three-vessel CAD predominantly affecting the distal LAD and left circumflex/OM 3, as well as chronic total occlusion of the proximal RCA.  Findings were noted to be similar to outside catheterization report from 2018.  He had patent stents of the mid LAD and mid to distal left circumflex with mild in-stent restenosis.  Normal left and right heart filling pressures were noted.  He had  borderline elevated PASP with elevated pulmonary vascular resistance.  Moderately reduced Fick CO/CI noted.  No interventional targets were appreciated with overall appearance similar to that of 2018.  Imdur 30 mg daily was added for antianginal effect.  He was continued on goal-directed medical therapy for chronic HFrEF due to ischemic cardiomyopathy.  Recommendations included escalation of GDMT, as well as repeat BMET to reassess renal function at RTC.  Restart of metformin was deferred pending BMET.  Repeat echo was to be discussed at follow-up visit.  If LVEF was less than 35% on repeat echo, it was noted ICD for primary prevention will need considered.  Today, 06/15/2021, he presents to clinic and notes that he is overall doing well from a cardiac standpoint since his catheterization.  Catheterization results reviewed in great detail.  We reviewed how stenting is performed and the catheterization diagram.  CTO was explained in detail.  Unfortunately, he did not tolerate Imdur 30 mg daily and reported that he was unable to take this due to extreme headache with this medication.  We will remove this medication from his list and add it to his intolerances.  He denies any chest pain since his catheterization.  He is worried about infection to his right radial arteriotomy site as well as right antecubital site and has thus had them covered since his catheterization.  Sites inspected today and without evidence of edema, erythema, or infection.  Bandages removed with recommendation that he keep the areas clean and dry and patient understanding.  He reports full range of motion with his wrist.  He denies any pain associated with the radial site.  We discussed his recent BMET and recommendation to restart metformin, and he confirms that he did restart metformin after he was called with those results.  Guideline directed medical therapy was discussed in great detail.  He denies any chest pain.  No further dyspnea  reported.  No shortness of breath at rest.  He denies any edema, abdominal distention, or orthopnea.  No signs or symptoms of bleeding.  It was reviewed that we would recheck an echo on GDMT, and if EF still reduced less than 35% at that time, ICD would need considered for primary prevention.  All of his questions were answered at that time.  He is agreeable to schedule an echo and work on increasing his medications as tolerated.    Unfortunately, escalation of GDMT is limited by his renal function and intolerances/allergies to medications.  Losartan is listed as an allergy and I was able to confirm that he does have angioedema-like symptoms when taking this medication, which precludes the addition of Entresto.  He reports cough with ACE.  As above, he does not tolerate Imdur/nitrates due to headache.  He does have CKD with recent BMET allowing him to restart his metformin.  We will trial low-dose Farxiga as below.  Home Medications   Current Outpatient Medications  Medication Instructions  albuterol (VENTOLIN HFA) 108 (90 Base) MCG/ACT inhaler 2 puffs, Inhalation, Every 6 hours PRN   amLODipine (NORVASC) 10 mg, Oral, Daily   aspirin EC 81 mg, Oral, Daily, Swallow whole.   atorvastatin (LIPITOR) 80 mg, Oral, Daily   bismuth subsalicylate (PEPTO BISMOL) 262 MG/15ML suspension 30 mLs, Oral, Every 6 hours PRN   blood glucose meter kit and supplies Dispense based on patient and insurance preference. Use up to four times daily as directed. (FOR ICD-10 E10.9, E11.9).   carvedilol (COREG) 25 MG tablet TAKE 1 TABLET(25 MG) BY MOUTH TWICE DAILY WITH A MEAL   clopidogrel (PLAVIX) 75 MG tablet TAKE 1 TABLET(75 MG) BY MOUTH DAILY   hydrochlorothiazide (HYDRODIURIL) 25 mg, Oral, Daily   isosorbide mononitrate (IMDUR) 30 mg, Oral, Daily   metFORMIN (GLUCOPHAGE) 500 MG tablet 1 tablet, Oral, 2 times daily   nitroGLYCERIN (NITROSTAT) 0.4 mg, Sublingual, Every 5 min PRN   pioglitazone (ACTOS) 45 MG tablet TAKE  1 TABLET(45 MG) BY MOUTH DAILY   VITAMIN D PO 1 capsule, Oral, Daily     Review of Systems    He denies chest pain, palpitations, dyspnea, pnd, orthopnea, n, v, dizziness, syncope, edema, weight gain, or early satiety.  He reports improvement of previous dyspnea.   All other systems reviewed and are otherwise negative except as noted above.  Physical Exam    VS:  BP 138/70   Pulse 67   Ht 5' 5"  (1.651 m)   Wt 187 lb 9.6 oz (85.1 kg)   BMI 31.22 kg/m  , BMI Body mass index is 31.22 kg/m. GEN: Well nourished, well developed, in no acute distress. HEENT: normal. Neck: Supple, no JVD, carotid bruits, or masses. Cardiac: RRR, no murmurs, rubs, or gallops. No clubbing, cyanosis, or pitting edema.  Radials/DP/PT 2+ and equal bilaterally.  Right radial arteriotomy site and right antecubital sites reviewed without any evidence of edema, erythema, or infection.  Bandages removed.  Some mild ecchymosis noted at the right antecubital site with reassurance provided. Respiratory:  Respirations regular and unlabored, clear to auscultation bilaterally. GI: Soft, nontender, nondistended, BS + x 4. MS: no deformity or atrophy. Skin: warm and dry, no rash. Neuro:  Strength and sensation are intact. Psych: Normal affect.  Accessory Clinical Findings    ECG personally reviewed by me today -sinus bradycardia, 58 bpm, IVCD with QRS 100 ms, nonspecific changes/repolarization changes, T wave inversion noted in lateral leads I, aVL, V5, V6- no acute changes.  VITALS Reviewed today   Temp Readings from Last 3 Encounters:  01/06/21 98.1 F (36.7 C) (Oral)  12/22/20 (!) 97.3 F (36.3 C) (Temporal)  11/22/20 97.9 F (36.6 C) (Temporal)   BP Readings from Last 3 Encounters:  01/13/21 138/70  01/06/21 140/73  12/22/20 122/70   Pulse Readings from Last 3 Encounters:  01/13/21 67  01/06/21 66  12/22/20 65    Wt Readings from Last 3 Encounters:  01/13/21 187 lb 9.6 oz (85.1 kg)  01/06/21 196 lb  (88.9 kg)  12/22/20 189 lb (85.7 kg)     LABS  reviewed today   Lab Results  Component Value Date   WBC 4.6 12/23/2020   HGB 14.1 12/23/2020   HCT 43.5 12/23/2020   MCV 87.7 12/23/2020   PLT 146 (L) 12/23/2020   Lab Results  Component Value Date   CREATININE 1.55 (H) 01/09/2021   BUN 24 (H) 01/09/2021   NA 139 01/09/2021   K 4.0 01/09/2021   CL 106 01/09/2021  CO2 26 01/09/2021   Lab Results  Component Value Date   ALT 15 11/22/2020   AST 15 11/22/2020   ALKPHOS 59 11/22/2020   BILITOT 0.8 11/22/2020   Lab Results  Component Value Date   CHOL 129 09/13/2020   HDL 53.80 09/13/2020   LDLCALC 63 09/13/2020   TRIG 62.0 09/13/2020   CHOLHDL 2 09/13/2020    Lab Results  Component Value Date   HGBA1C 7.2 (A) 12/22/2020   Lab Results  Component Value Date   TSH 0.67 09/29/2019     STUDIES/PROCEDURES reviewed today   LHC 01/06/21 Coronary Findings   Diagnostic Dominance: Right  Left Main  Vessel is large. Vessel is angiographically normal.  Left Anterior Descending  Mid LAD lesion is 20% stenosed. The lesion was previously treatedover 2 years ago. Previously placed stent displays restenosis.  Dist LAD lesion is 100% stenosed. The lesion is chronically occluded.  First Diagonal Branch  Vessel is moderate in size.  1st Diag lesion is 40% stenosed.  Second Diagonal Branch  Vessel is small in size.  2nd Diag lesion is 70% stenosed.  Third Diagonal Branch  Vessel is small in size.  Left Circumflex  Vessel is large.  Dist Cx lesion is 10% stenosed. The lesion was previously treatedover 2 years ago. Previously placed stent displays restenosis.  First Obtuse Marginal Branch  Vessel is small in size.  1st Mrg lesion is 40% stenosed.  Second Obtuse Marginal Branch  Vessel is small in size.  2nd Mrg lesion is 50% stenosed.  Third Obtuse Marginal Branch  Vessel is moderate in size. There is moderate disease in the vessel.  3rd Mrg lesion is 95% stenosed.   Right Coronary Artery  Vessel is moderate in size.  Prox RCA lesion is 100% stenosed. The lesion is chronically occluded. The lesion was previously treatedover 2 years ago. Previously placed stent displays restenosis.  Right Posterior Descending Artery  Collaterals  RPDA filled by collaterals from 1st Sept.     Intervention   No interventions have been documented.  Right Heart  Right Heart Pressures RA (mean): 6 mmHg RV (S/EDP): 40/7 mmHg PA (S/D, mean): 40/16 (23) mmHg PCWP (mean): 10 mmHg  Ao sat: 98% PA sat: 66%  Fick CO: 3.7 L/min Fick CI: 1.9 L/min/m^2  PVR: 3.5 Wood units   Left Heart  Left Ventricle LV end diastolic pressure is normal. LVEDP 11 mmHg.  Aortic Valve There is no aortic valve stenosis.   Coronary Diagrams   Diagnostic Dominance: Right    Intervention    Conclusions: Severe three-vessel coronary artery disease predominantly affecting the distal LAD and LCx/OM3 as well as chronic total occlusion of proximal RCA.  Findings appear to be similar to outside catheterization report from 2018. Patent stents in the mid LAD and mid/distal LCx with mild in-stent restenosis. Normal left and right heart filling pressures. Borderline elevated pulmonary artery pressure with elevated pulmonary vascular resistance. Moderately reduced Fick cardiac output/index. Recommendations: No interventional targets appreciated with overall appearance similar to catheterization report from outside hospital in 2018.  I will add isosorbide mononitrate 30 mg daily for antianginal therapy. Continue goal-directed medical therapy for chronic HFrEF due to ischemic cardiomyopathy.  Medications should be escalated as tolerated. Basic metabolic panel early next week to reassess renal function.  Defer restarting metformin pending BMP. Discussed repeat transthoracic echocardiogram at follow-up visit.  If LVEF is less than 35%, ICD for primary prevention will need to be  considered.  Stress test 12/12/2020 Blood  pressure demonstrated a hypertensive response to exercise. EKG is not interpretable with stress due to baseline abnormalities. Defect 1: There is a large defect of severe severity present in the basal inferoseptal, basal inferior, basal inferolateral, mid inferoseptal, mid inferior, mid inferolateral and apical inferior location. Findings consistent with prior myocardial infarction with significant peri-infarct ischemia. This is a high risk study. Nuclear stress EF: 30%.  Assessment & Plan    Coronary artery disease --Denies any chest pain at rest or with exertion since his catheterization.  EKG today without acute ST/T changes from that of previous.  Recently underwent stress test, which was ruled high risk.  Subsequent right and left heart catheterization as above with recommendation for escalation of GDMT and echo to reassess EF.  Consideration of ICD was recommended if LVEF remains less than 35%.  He was started on Imdur, but unfortunately, he did not tolerate this medication due to headache and has discontinued it (added to intolerances).  Repeat BMET showed stable renal function with patient restarted on metformin.  We will attempt to escalate GDMT further today with [place].  Unfortunately, due to angioedema reported with losartan, we are unable to add Entresto to his medications.  Will schedule echo.  Aggressive risk factor modification discussed with strict control of LDL and ongoing lifestyle changes recommended with diet and exercise.  Continue ASA, Plavix, carvedilol, atorvastatin.  As needed sublingual nitro can be continued if tolerated given headache with Imdur.  HFrEF/ischemic cardiomyopathy --Euvolemic and well compensated on exam.  Continue GDMT with carvedilol and Norvasc 10 mg daily.  Continue HCTZ 25 mg daily.  He is unable to tolerate ACE/ARB as above.  Farxiga added today with recommendation to repeat a BMET at [place].  Escalation of GDMT  complicated by renal function and intolerances/allergies to medications.  We will recheck an echo, and if LVEF remains less than 35%, ICD should be considered as above.  Reviewed recommendation for salt and fluid restrictions, as well as increased activity and dietary changes.  Essential hypertension, goal BP less than 130/80 - BP today borderline and not yet at goal of 130/80.  He recently started HCTZ 25 mg daily and continues on carvedilol 25 mg twice daily and amlodipine 10 mg daily.  He is unable to tolerate ACE/ARB as above.  He cannot tolerate Imdur due to headache.  We will add [place] today, which may help BP control; however, suspect additional antihypertensives will be needed at RTC.  Reviewed recommendation for salt and fluid restrictions.  Increase activity and dietary changes discussed.  Discussed importance of BP control moving forward.  HLD, goal LDL below 70 --Continue atorvastatin 80 mg daily.  Medication changes: Start Farxiga 5 mg daily to be escalated as tolerated Labs ordered: BMET at RTC (after starting [place]) Studies / Imaging ordered: Echo (assess his EF still under 35%) Future considerations: Consideration of ICD if EF remains under 35% Disposition: RTC after echo  *Please be aware that the above documentation was completed voice recognition software and may contain dictation errors.    Arvil Chaco, PA-C 01/13/2021

## 2021-01-13 NOTE — Patient Instructions (Signed)
Medication Instructions:  START FARXIGA 5MG  DAILY  *If you need a refill on your cardiac medications before your next appointment, please call your pharmacy*   Lab Work: BMP to be drawn in 2 weeks at the [facility].  If you have labs (blood work) drawn today and your tests are completely normal, you will receive your results only by: MyChart Message (if you have MyChart) OR A paper copy in the mail If you have any lab test that is abnormal or we need to change your treatment, we will call you to review the results.   Testing/Procedures: Your physician has requested that you have an echocardiogram. Echocardiography is a painless test that uses sound waves to create images of your heart. It provides your doctor with information about the size and shape of your heart and how well your heart's chambers and valves are working. This procedure takes approximately one hour. There are no restrictions for this procedure.  Follow-Up: At [facility], you and your health needs are our priority.  As part of our continuing mission to provide you with exceptional heart care, we have created designated Provider Care Teams.  These Care Teams include your primary Cardiologist (physician) and Advanced Practice Providers (APPs -  Physician Assistants and Nurse Practitioners) who all work together to provide you with the care you need, when you need it.  We recommend signing up for the patient portal called "MyChart".  Sign up information is provided on this After Visit Summary.  MyChart is used to connect with patients for Virtual Visits (Telemedicine).  Patients are able to view lab/test results, encounter notes, upcoming appointments, etc.  Non-urgent messages can be sent to your provider as well.   To learn more about what you can do with MyChart, go to [facility].    Your next appointment:   F/U after echo  The format for your next appointment:   In Person  Provider:   You may  see [email or web address], MD or one of the following Advanced Practice Providers on your designated Care Team:   Yvonne Kendall, NP Nicolasa Ducking, PA-C Eula Listen, PA-C Cadence Arcad[place], [place], NP

## 2021-01-16 ENCOUNTER — Encounter: Payer: Self-pay | Admitting: Physician Assistant

## 2021-01-18 ENCOUNTER — Ambulatory Visit: Payer: Medicare Other | Admitting: Internal Medicine

## 2021-01-18 NOTE — Addendum Note (Signed)
Addended by: Ileene Musa D on: 01/18/2021 04:02 PM   Modules accepted: Orders

## 2021-01-23 ENCOUNTER — Other Ambulatory Visit
Admission: RE | Admit: 2021-01-23 | Discharge: 2021-01-23 | Disposition: A | Discharge status: Home / Self Care | Payer: Medicare Other | Source: Ambulatory Visit | Attending: Physician Assistant | Admitting: Physician Assistant

## 2021-01-23 DIAGNOSIS — Z79899 Other long term (current) drug therapy: Secondary | ICD-10-CM | POA: Diagnosis present

## 2021-01-23 DIAGNOSIS — I5022 Chronic systolic (congestive) heart failure: Secondary | ICD-10-CM | POA: Diagnosis present

## 2021-01-23 LAB — BASIC METABOLIC PANEL
Anion gap: 6 (ref 5–15)
BUN: 27 mg/dL — ABNORMAL HIGH (ref 8–23)
CO2: 24 mmol/L (ref 22–32)
Calcium: 9.2 mg/dL (ref 8.9–10.3)
Chloride: 104 mmol/L (ref 98–111)
Creatinine, Ser: 1.6 mg/dL — ABNORMAL HIGH (ref 0.61–1.24)
GFR, Estimated: 45 mL/min — ABNORMAL LOW (ref >60)
Glucose, Bld: 271 mg/dL — ABNORMAL HIGH (ref 70–99)
Potassium: 4 mmol/L (ref 3.5–5.1)
Sodium: 134 mmol/L — ABNORMAL LOW (ref 135–145)

## 2021-03-01 ENCOUNTER — Other Ambulatory Visit: Payer: Self-pay

## 2021-03-01 ENCOUNTER — Ambulatory Visit (INDEPENDENT_AMBULATORY_CARE_PROVIDER_SITE_OTHER): Payer: Medicare Other

## 2021-03-01 DIAGNOSIS — I5022 Chronic systolic (congestive) heart failure: Secondary | ICD-10-CM | POA: Diagnosis not present

## 2021-03-01 LAB — ECHOCARDIOGRAM COMPLETE
AR max vel: 2.07 cm2
AV Area VTI: 2.07 cm2
AV Area mean vel: 1.81 cm2
AV Mean grad: 3 mmHg
AV Peak grad: 4.9 mmHg
Ao pk vel: 1.11 m/s
Area-P 1/2: 3.6 cm2
Calc EF: 32.3 %
S' Lateral: 4.8 cm
Single Plane A2C EF: 39.5 %
Single Plane A4C EF: 28.2 %

## 2021-03-06 ENCOUNTER — Telehealth: Payer: Self-pay | Admitting: *Deleted

## 2021-03-06 NOTE — Telephone Encounter (Signed)
Left voicemail message to call back to review results.  

## 2021-03-06 NOTE — Telephone Encounter (Signed)
-----   Message from Lennon Alstrom, PA-C sent at 03/06/2021 12:03 AM EDT ----- Echo shows pump function still low at 30-35%. Escalation of medical therapy has been limited by medication intolerances / allergies and renal function.  Given his ongoing low pump function, recommend he keep his upcoming appointment with Dr. Okey Dupre for discussion regarding consideration of ICD (given limits on escalation of GDMT).

## 2021-03-07 NOTE — Telephone Encounter (Signed)
Left voicemail message to call back for review of results and recommendations.  

## 2021-03-08 ENCOUNTER — Ambulatory Visit: Payer: Medicare Other | Admitting: Internal Medicine

## 2021-03-08 NOTE — Telephone Encounter (Signed)
3rd attempt to contact the patient with results.[email or web address]. Letter mailed for the patient to contact the office for results.

## 2021-03-08 NOTE — Progress Notes (Deleted)
Follow-up Outpatient Visit Date: 03/08/2021  Primary Care Provider: Lynnda Child, MD [address] [place] [address]  Chief Complaint: ***  HPI:  [name] is a 70 y.o. 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  He was last seen in our office in mid June after right and left heart catheterization for work-up of his cardiomyopathy.  Catheterization had shown severe three-vessel CAD, felt to be similar to prior outside studies as recently as 2018.  He was feeling well at his follow-up visit.  He was unable to tolerate low-dose isosorbide mononitrate due to headaches.  He was started on Farxiga.  --------------------------------------------------------------------------------------------------  Cardiovascular History & Procedures: Cardiovascular Problems: Coronary artery disease with prior MI   Risk Factors: Known CAD, hypertension, diabetes mellitus, male gender, obesity, and age > 55   Cath/PCI: PCI to mid LAD in 08/2001 PCI to proximal LAD and mid LCx in 06/2005 PCI to proximal LAD for ISR in 02/2010 (patient declined single-vessel CABG at that time PCI to proximal LCx in setting of NSTEMI in 08/2012 PCI to proximal RCA in the setting of STEMI in 12/2012 LHC in 07/2016 demonstrating normal LMCA, diffuse 30% mid LAD stenosis and diffuse distal LAD disease of 70 to 80%, D1 with proximal diffuse 95% stenosis, ramus intermedius with ostial and proximal 90% stenosis and diffuse disease beyond, nondominant LCx with patent proximal and mid vessel stents.  Severe diffuse 99% stenosis in the distal vessel.  RCA with 100% proximal occlusion with left-to-right collaterals. R/LHC (01/06/2021): Severe three-vessel coronary artery disease predominantly affecting distal LAD and LCx/OM3 as well as CTO of  proximal RCA.  Patent stents in mid LAD and mid/distal LCx with mild in-stent restenosis.  Normal left and right heart filling pressures.  Borderline pulmonary hypertension.  Moderately reduced Fick cardiac output/index.   CV Surgery: None   EP Procedures and Devices: None   Non-Invasive Evaluation(s): TTE (03/01/2021): Normal LV size with mild LVH.  LVEF 30-35% with severe hypokinesis of the inferolateral wall.  Normal RV size and function.  Normal biatrial size.  No significant valvular abnormality. Pharmacologic MPI (12/12/2020): High risk study with large inferior, inferoseptal, and inferolateral defect consistent with scar and peri-infarct ischemia.  LVEF 30%. TTE (08/28/2016): LVEF 35-40% with mild global hypokinesis and more severe hypokinesis of the inferior, inferoseptal, and mid anterior walls.  Mild mitral annular calcification and moderate mitral regurgitation.  Normal diastolic function.  Normal PA pressure.  Recent CV Pertinent Labs: Lab Results  Component Value Date   CHOL 129 09/13/2020   HDL 53.80 09/13/2020   LDLCALC 63 09/13/2020   TRIG 62.0 09/13/2020   CHOLHDL 2 09/13/2020   K 4.0 01/23/2021   BUN 27 (H) 01/23/2021   BUN 23 (A) 09/29/2019   CREATININE 1.60 (H) 01/23/2021    Past medical and surgical history were reviewed and updated in EPIC.  No outpatient medications have been marked as taking for the 03/08/21 encounter (Appointment) with Khyli Swaim, Cristal Deer, MD.    Allergies: Imdur [isosorbide nitrate], Lipitor [atorvastatin], Losartan potassium, Lisinopril, Rosuvastatin, and Simvastatin  Social History   Tobacco Use   Smoking status: Never   Smokeless tobacco: Never  Vaping Use   Vaping Use: Never used  Substance Use Topics   Alcohol use: Not Currently   Drug use: Never    Family History  Problem  Relation Age of Onset   Alcohol abuse Mother    Arthritis Mother    Alcohol abuse Father    Heart attack Father 37   Hypertension Father    Alcohol abuse  Brother    Drug abuse Brother    Alcohol abuse Brother    Mental illness Brother     Review of Systems: A 12-system review of systems was performed and was negative except as noted in the HPI.  --------------------------------------------------------------------------------------------------  Physical Exam: There were no vitals taken for this visit.  General:  NAD. Neck: No JVD or HJR. Lungs: Clear to auscultation bilaterally without wheezes or crackles. Heart: Regular rate and rhythm without murmurs, rubs, or gallops. Abdomen: Soft, nontender, nondistended. Extremities: No lower extremity edema.  EKG:  ***  Lab Results  Component Value Date   WBC 4.6 12/23/2020   HGB 14.1 12/23/2020   HCT 43.5 12/23/2020   MCV 87.7 12/23/2020   PLT 146 (L) 12/23/2020    Lab Results  Component Value Date   NA 134 (L) 01/23/2021   K 4.0 01/23/2021   CL 104 01/23/2021   CO2 24 01/23/2021   BUN 27 (H) 01/23/2021   CREATININE 1.60 (H) 01/23/2021   GLUCOSE 271 (H) 01/23/2021   ALT 15 11/22/2020    Lab Results  Component Value Date   CHOL 129 09/13/2020   HDL 53.80 09/13/2020   LDLCALC 63 09/13/2020   TRIG 62.0 09/13/2020   CHOLHDL 2 09/13/2020    --------------------------------------------------------------------------------------------------  ASSESSMENT AND PLAN: Cristal Deer Emma-Lee Oddo, MD 03/08/2021 7:06 AM

## 2021-03-09 ENCOUNTER — Encounter: Payer: Self-pay | Admitting: Internal Medicine

## 2021-03-14 ENCOUNTER — Ambulatory Visit: Payer: Medicare Other | Admitting: Physician Assistant

## 2021-03-21 NOTE — Telephone Encounter (Signed)
Reviewed results and recommendations with patient. Scheduled appointment to come in and see APP for review of further information. Previous appointments were no show and cancellation. Reviewed importance of coming in to discuss further possible treatments with provider. He verbalized understanding, was agreeable with plan, and had no further questions at this time.

## 2021-03-29 ENCOUNTER — Other Ambulatory Visit: Payer: Self-pay | Admitting: Family Medicine

## 2021-03-31 ENCOUNTER — Ambulatory Visit
Admission: RE | Admit: 2021-03-31 | Discharge: 2021-03-31 | Disposition: A | Discharge status: Home / Self Care | Payer: Medicare Other | Source: Ambulatory Visit | Attending: Urology | Admitting: Urology

## 2021-03-31 ENCOUNTER — Other Ambulatory Visit: Payer: Self-pay

## 2021-03-31 DIAGNOSIS — N2889 Other specified disorders of kidney and ureter: Secondary | ICD-10-CM | POA: Diagnosis not present

## 2021-03-31 IMAGING — MR MR ABDOMEN WO/W CM
18 series · 48 of 48 positions shown · IV contrast (9ml Gadavist)
Comparison: MRI [DATE] and [DATE].

CLINICAL DATA: Follow up complex cystic left renal lesion. No new
symptoms.

EXAM:
MRI ABDOMEN WITHOUT AND WITH CONTRAST
TECHNIQUE: Multiplanar multisequence MR imaging of the abdomen was performed
both before and after the administration of intravenous contrast.
CONTRAST:  9mL GADAVIST GADOBUTROL 1 MMOL/ML IV SOLN

[Series 3: T2 · coronal · 6.0mm · 1.19mm/px · 2 of 35 slices shown (1 of 2)]
[im 1/35]
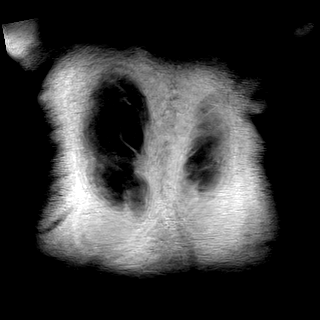
[im 35/35]
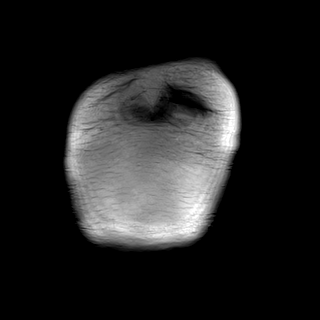

[Series 4: T2 · axial · 6.0mm · 1.19mm/px · z∈[-96,+163]mm · 2 of 37 slices shown (2 of 2)]
[im 1/37]
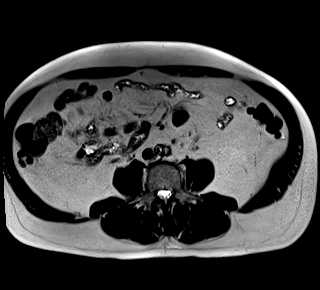
[im 37/37]
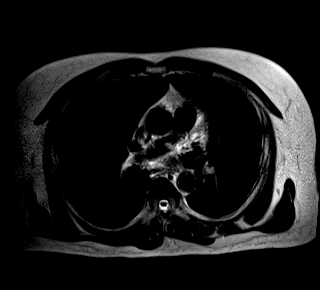

[Series 6: T2 fat-sat · axial · 6.0mm · 1.19mm/px · z∈[-96,+141]mm · 2 of 34 slices shown]
[im 1/34]
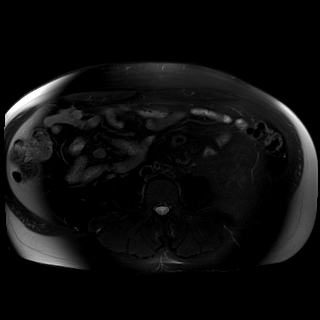
[im 34/34]
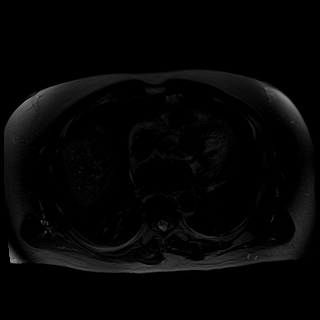

[Series 7: ax dwi_tracew · axial · 6.0mm · 1.42mm/px · z∈[-96,+141]mm · 4 of 102 slices shown]
[im 1/102]
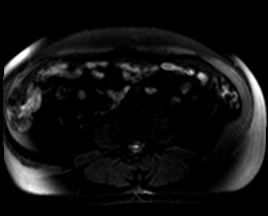
[im 34/102]
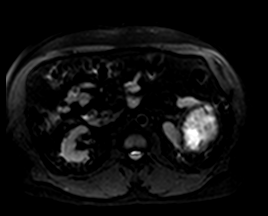
[im 68/102]
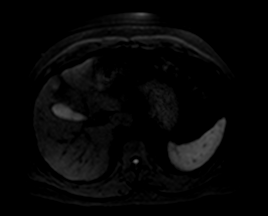
[im 102/102]
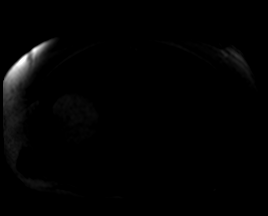

[Series 8: ax dwi_adc · axial · 6.0mm · 1.42mm/px · 1 of 34 slices shown]
[im 1/34]
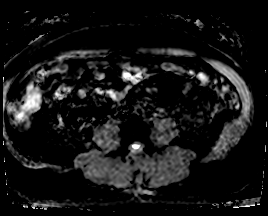

[Series 9: in & out · axial · 3.0mm · 1.19mm/px · z∈[-94,+143]mm · 3 of 80 slices shown (1 of 2)]
[im 1/80]
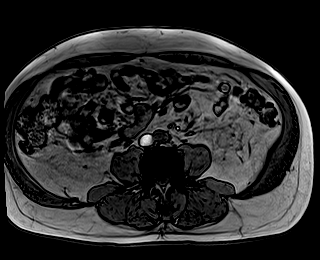
[im 40/80]
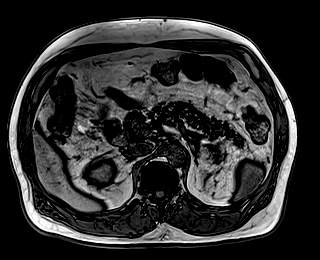
[im 80/80]
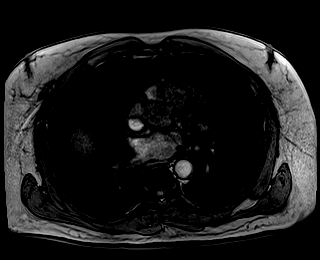

[Series 10: in & out · axial · 3.0mm · 1.19mm/px · z∈[-94,+143]mm · 3 of 80 slices shown (2 of 2)]
[im 1/80]
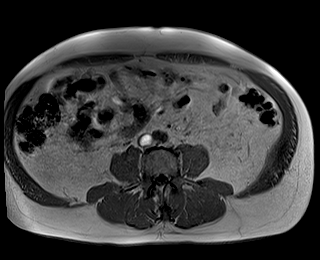
[im 40/80]
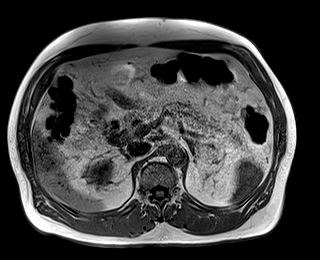
[im 80/80]
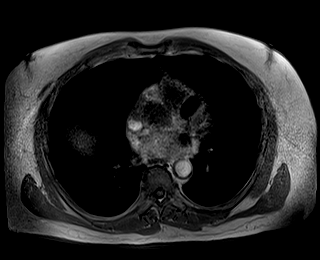

[Series 11: bSSFP · axial · 6.0mm · 0.74mm/px · 1 of 37 slices shown]
[im 1/37]
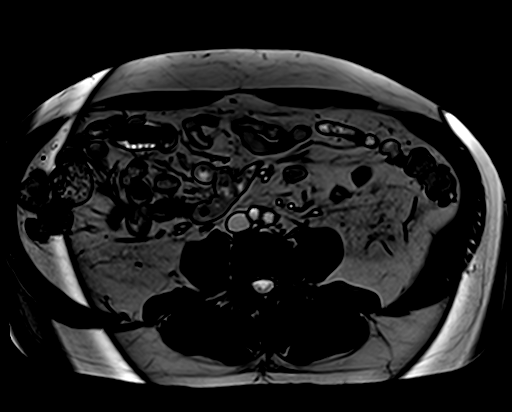

[Series 12: T1 dynamic fat-sat · axial · non-contrast · 3.0mm · 1.19mm/px · z∈[-97,+164]mm · 3 of 88 slices shown (1 of 5)]
[im 1/88]
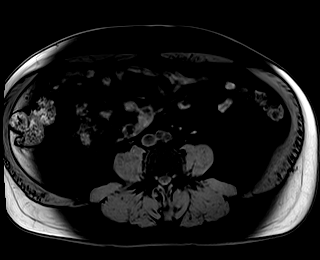
[im 44/88]
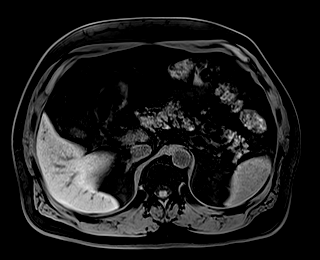
[im 88/88]
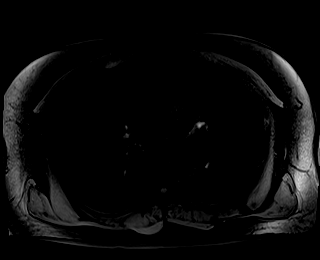

[Series 13: T1 dynamic fat-sat post-contrast · axial · 3.0mm · 1.19mm/px · z∈[-97,+164]mm · 3 of 88 slices shown (1 of 4)]
[im 1/88]
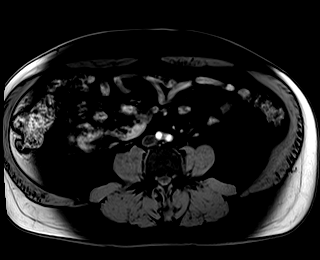
[im 44/88]
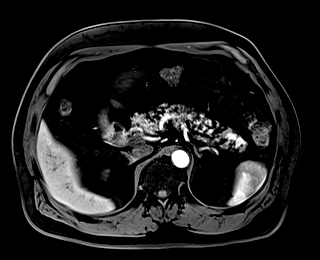
[im 88/88]
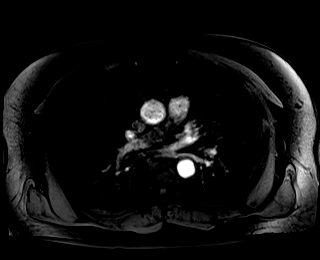

[Series 14: T1 dynamic fat-sat · axial · 3.0mm · 1.19mm/px · z∈[-97,+164]mm · 3 of 88 slices shown (2 of 5)]
[im 1/88]
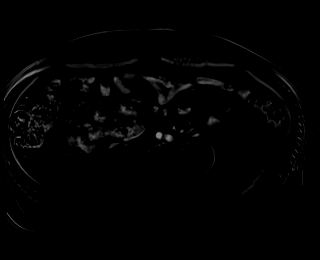
[im 44/88]
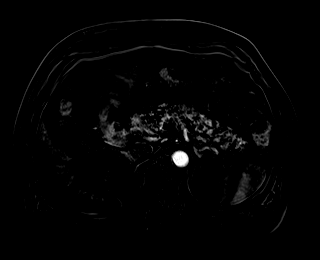
[im 88/88]
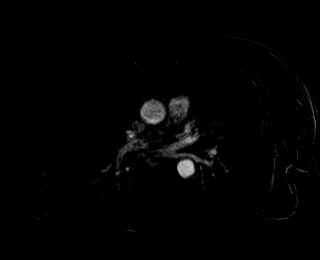

[Series 15: T1 dynamic fat-sat post-contrast · axial · 3.0mm · 1.19mm/px · z∈[-97,+164]mm · 3 of 88 slices shown (2 of 4)]
[im 1/88]
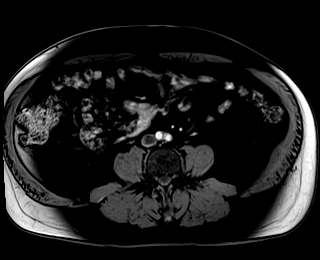
[im 44/88]
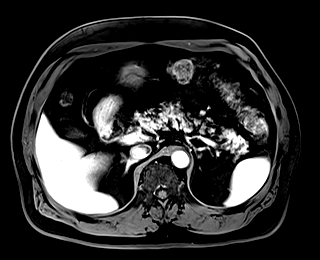
[im 88/88]
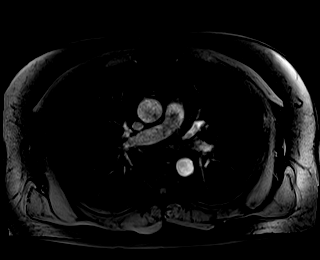

[Series 16: T1 dynamic fat-sat · axial · 3.0mm · 1.19mm/px · z∈[-97,+164]mm · 3 of 88 slices shown (3 of 5)]
[im 1/88]
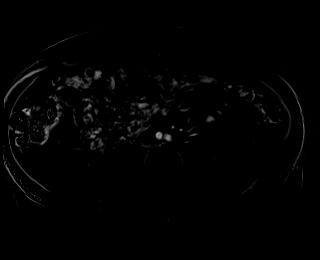
[im 44/88]
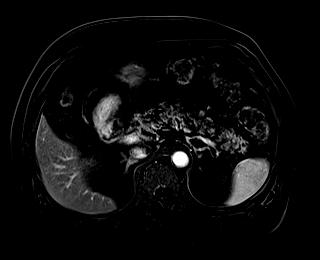
[im 88/88]
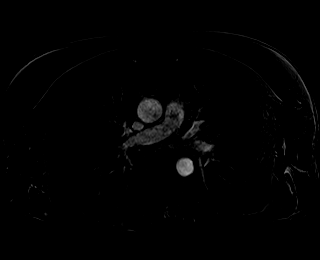

[Series 17: T1 dynamic fat-sat post-contrast · axial · 3.0mm · 1.19mm/px · z∈[-97,+164]mm · 3 of 88 slices shown (3 of 4)]
[im 1/88]
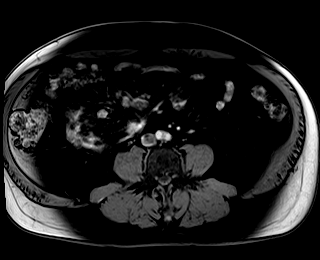
[im 44/88]
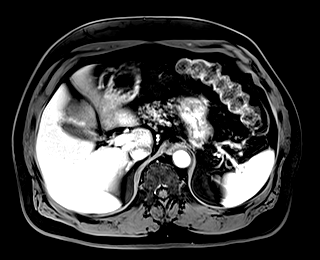
[im 88/88]
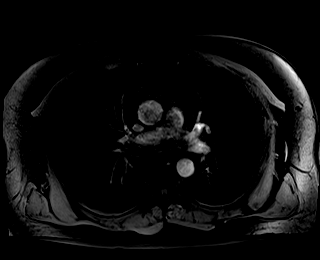

[Series 18: T1 dynamic fat-sat · axial · 3.0mm · 1.19mm/px · z∈[-97,+164]mm · 3 of 88 slices shown (4 of 5)]
[im 1/88]
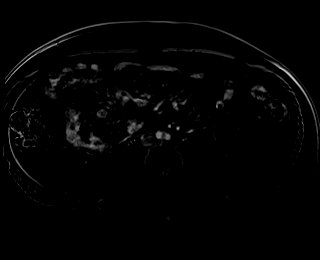
[im 44/88]
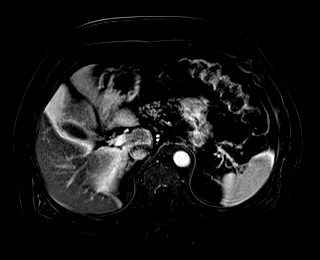
[im 88/88]
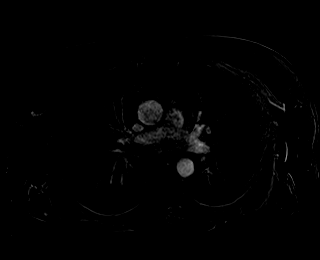

[Series 19: T1 dynamic post-contrast · coronal · 3.0mm · 1.31mm/px · 3 of 72 slices shown]
[im 1/72]
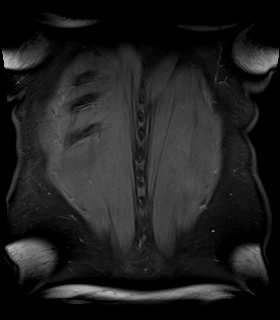
[im 36/72]
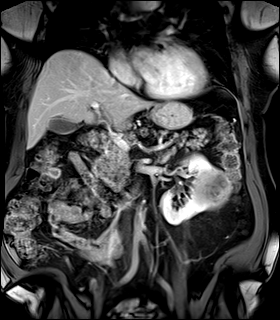
[im 72/72]
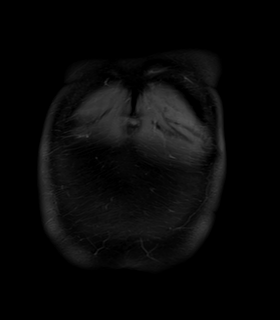

[Series 20: T1 dynamic fat-sat post-contrast · axial · 3.0mm · 1.19mm/px · z∈[-97,+164]mm · 3 of 88 slices shown (4 of 4)]
[im 1/88]
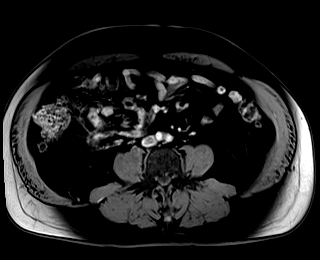
[im 44/88]
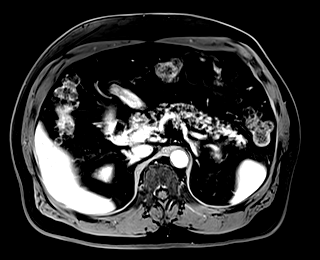
[im 88/88]
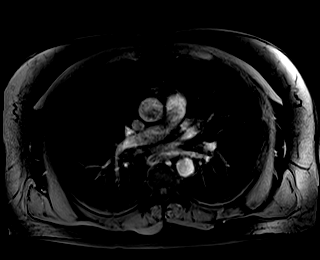

[Series 21: T1 dynamic fat-sat · axial · 3.0mm · 1.19mm/px · z∈[-97,+164]mm · 3 of 88 slices shown (5 of 5)]
[im 1/88]
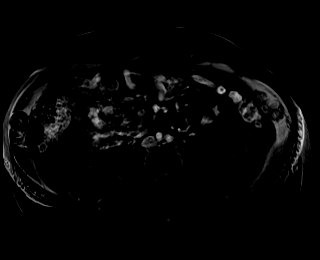
[im 44/88]
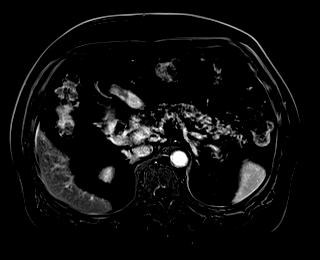
[im 88/88]
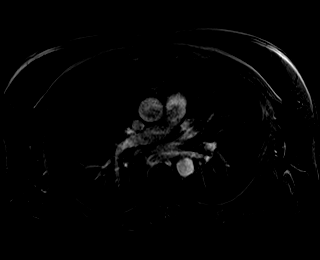

[48 of 48 positions shown; findings below may reference images not displayed]

FINDINGS: Lower chest: The visualized lower chest appears unremarkable aside
from mild right hemidiaphragm elevation.

Hepatobiliary: The liver is normal in signal without steatosis,
focal lesion or abnormal enhancement. No evidence of gallstones,
gallbladder wall thickening or biliary dilatation.

Pancreas: Unremarkable. No pancreatic ductal dilatation or
surrounding inflammatory changes.

Spleen: Normal in size without focal abnormality.

Adrenals/Urinary Tract: Both adrenal glands appear normal. No
significant abnormality of the right kidney demonstrated. Large mass
in the interpolar region of the left kidney is unchanged in size
from the most recent study, measuring 8.4 x 7.2 cm. This
demonstrates heterogeneous intrinsic T1 shortening as well as
heterogeneous T2 signal with a peripheral rim of low T2 signal which
may reflect hemosiderin or calcification. No contrast enhancement of
this lesion is seen following contrast, best demonstrated on the
subtraction images. No new or enlarging lesions are identified.

Stomach/Bowel: The visualized portions of the stomach and small
bowel appear unremarkable. Diverticular changes are again noted
within the colon.

Vascular/Lymphatic: There are no enlarged abdominal lymph nodes.
Aortic and branch vessel atherosclerosis without evidence of large
vessel occlusion. The renal veins and IVC appear normal.

Other: No evidence of abdominal wall hernia or ascites.

Musculoskeletal: No acute or significant osseous findings.
IMPRESSION: 1. Stable complex mass in the interpolar region of the left kidney
from previous study of 6 months ago, minimally enlarged from
baseline study of 13 months ago. This lesion demonstrates no
abnormal enhancement and remains most consistent with a complex
hemorrhagic cyst. Because of the size and complexity of this lesion,
this is best classified as a Bosniak 2 F lesion, and follow-up by CT
or MR in 1 year is recommended to better demonstrate stability.
2. No new or enlarging renal lesions.
3. Colonic diverticulosis.

## 2021-03-31 MED ORDER — GADOBUTROL 1 MMOL/ML IV SOLN
9.0000 mL | Freq: Once | INTRAVENOUS | Status: AC | PRN
  Administered 2021-03-31: 9 mL via INTRAVENOUS

## 2021-04-04 NOTE — Progress Notes (Incomplete)
 04/04/21 8:24 AM   Donald Skinner 03/13/1951 [id number]  Referring provider:  Cody, Jessica R, MD [address] [place],  [place] [address] No chief complaint on file.    HPI: Donald Skinner is a 70 y.o.male with a personal history of renal mass, who presents today for 6 month follow-up with MRI.   Renal ultrasound performed in [place] on 09/30/2019 revealed remarkable for a 6.3 x 6.3  x 6.8 cm left renal mass. 05/30/2018 MRI showed mass measuring 7.1 x 7.4 cm. Images were not available but felt highly suspicious for renal cell carcinoma.   Abdominal MRI w/w/o contrast on 02/22/2020 showed 7.8 cm complex hemorrhagic cystic lesion in the lateral midpole of the left kidney.  A benign hemorrhagic cyst is favored. No evidence of abdominal metastatic disease. Colonic diverticulosis. Repeat MRI on 09/23/2020 revelaed slight interval enlargement up to 82 cm but there was no definitive enhancement on study and per the radiologist, it favored a benign hemorrhagic cyst. There were som complexity to the cyst.   MRI on 04/02/2021  revealed Both adrenal glands appear normal. No significant abnormality of the right kidney demonstrated. Large mass in the interpolar region of the left kidney is unchanged in size from the most recent study, measuring 8.4 x 7.2 cm. This demonstrates heterogeneous intrinsic T1 shortening as well as heterogeneous T2 signal with a peripheral rim of low T2 signal which may reflect hemosiderin or calcification. No contrast enhancement of this lesion is seen following contrast, best demonstrated on the subtraction images. No new or enlarging lesions are identified.     PMH: Past Medical History:  Diagnosis Date   Allergy    Diabetes mellitus without complication (HCC)    Heart disease    Hyperlipidemia    Hypertension    Myocardial infarction (HCC)     Surgical History: Past Surgical History:  Procedure Laterality Date   Cardiac stents     x 6 stents- had 5 heart attacks    RIGHT/LEFT HEART CATH AND CORONARY ANGIOGRAPHY N/A 01/06/2021   Procedure: RIGHT/LEFT HEART CATH AND CORONARY ANGIOGRAPHY;  Surgeon: End, Christopher, MD;  Location: [facility] INVASIVE CV LAB;  Service: Cardiovascular;  Laterality: N/A;    Home Medications:  Allergies as of 04/05/2021       Reactions   Imdur [isosorbide Nitrate]    headache   Lipitor [atorvastatin]    myalgia   Losartan Potassium Swelling   Tongue swelling   Lisinopril Cough   Rosuvastatin Other (See Comments)   myalgias   Simvastatin Other (See Comments)   weakness        Medication List        Accurate as of April 04, 2021  8:24 AM. If you have any questions, ask your nurse or doctor.          albuterol 108 (90 Base) MCG/ACT inhaler Commonly known as: VENTOLIN HFA Inhale 2 puffs into the lungs every 6 (six) hours as needed for wheezing or shortness of breath.   amLODipine 10 MG tablet Commonly known as: NORVASC Take 1 tablet (10 mg total) by mouth daily.   aspirin EC 81 MG tablet Take 81 mg by mouth daily. Swallow whole.   atorvastatin 80 MG tablet Commonly known as: LIPITOR Take 1 tablet (80 mg total) by mouth daily.   bismuth subsalicylate 262 MG/15ML suspension Commonly known as: PEPTO BISMOL Take 30 mLs by mouth every 6 (six) hours as needed for indigestion or diarrhea or loose stools.   blood   glucose meter kit and supplies Dispense based on patient and insurance preference. Use up to four times daily as directed. (FOR ICD-10 E10.9, E11.9).   carvedilol 25 MG tablet Commonly known as: COREG TAKE 1 TABLET(25 MG) BY MOUTH TWICE DAILY WITH A MEAL   clopidogrel 75 MG tablet Commonly known as: PLAVIX TAKE 1 TABLET(75 MG) BY MOUTH DAILY   dapagliflozin propanediol 5 MG Tabs tablet Commonly known as: Farxiga Take 1 tablet (5 mg total) by mouth daily before breakfast.   hydrochlorothiazide 25 MG tablet Commonly known as: HYDRODIURIL Take 1 tablet (25 mg total) by mouth daily.    isosorbide mononitrate 30 MG 24 hr tablet Commonly known as: IMDUR Take 1 tablet (30 mg total) by mouth daily.   metFORMIN 500 MG tablet Commonly known as: GLUCOPHAGE TAKE 1 TABLET(500 MG) BY MOUTH TWICE DAILY WITH A MEAL   nitroGLYCERIN 0.4 MG SL tablet Commonly known as: NITROSTAT Place 0.4 mg under the tongue every 5 (five) minutes as needed for chest pain.   pioglitazone 45 MG tablet Commonly known as: ACTOS TAKE 1 TABLET(45 MG) BY MOUTH DAILY   VITAMIN D PO Take 1 capsule by mouth daily.        Allergies:  Allergies  Allergen Reactions   Imdur [Isosorbide Nitrate]     headache   Lipitor [Atorvastatin]     myalgia   Losartan Potassium Swelling    Tongue swelling   Lisinopril Cough   Rosuvastatin Other (See Comments)    myalgias   Simvastatin Other (See Comments)    weakness    Family History: Family History  Problem Relation Age of Onset   Alcohol abuse Mother    Arthritis Mother    Alcohol abuse Father    Heart attack Father 45   Hypertension Father    Alcohol abuse Brother    Drug abuse Brother    Alcohol abuse Brother    Mental illness Brother     Social History:  reports that he has never smoked. He has never used smokeless tobacco. He reports that he does not currently use alcohol. He reports that he does not use drugs.   Physical Exam: There were no vitals taken for this visit.  Constitutional:  Alert and oriented, No acute distress. HEENT: [place] AT, moist mucus membranes.  Trachea midline, no masses. Cardiovascular: No clubbing, cyanosis, or edema. Respiratory: Normal respiratory effort, no increased work of breathing. Skin: No rashes, bruises or suspicious lesions. Neurologic: Grossly intact, no focal deficits, moving all 4 extremities. Psychiatric: Normal mood and affect.  Laboratory Data:  Lab Results  Component Value Date   CREATININE 1.60 (H) 01/23/2021    Lab Results  Component Value Date   PSA 0.3 09/29/2019     Lab  Results  Component Value Date   HGBA1C 7.2 (A) 12/22/2020    Urinalysis   Pertinent Imaging: CLINICAL DATA:  Follow up complex cystic left renal lesion. No new symptoms.   EXAM: MRI ABDOMEN WITHOUT AND WITH CONTRAST   TECHNIQUE: Multiplanar multisequence MR imaging of the abdomen was performed both before and after the administration of intravenous contrast.   CONTRAST:  9mL GADAVIST GADOBUTROL 1 MMOL/ML IV SOLN   COMPARISON:  MRI 09/23/2020 and 02/22/2020.   FINDINGS: Lower chest: The visualized lower chest appears unremarkable aside from mild right hemidiaphragm elevation.   Hepatobiliary: The liver is normal in signal without steatosis, focal lesion or abnormal enhancement. No evidence of gallstones, gallbladder wall thickening or biliary dilatation.   Pancreas:   Unremarkable. No pancreatic ductal dilatation or surrounding inflammatory changes.   Spleen: Normal in size without focal abnormality.   Adrenals/Urinary Tract: Both adrenal glands appear normal. No significant abnormality of the right kidney demonstrated. Large mass in the interpolar region of the left kidney is unchanged in size from the most recent study, measuring 8.4 x 7.2 cm. This demonstrates heterogeneous intrinsic T1 shortening as well as heterogeneous T2 signal with a peripheral rim of low T2 signal which may reflect hemosiderin or calcification. No contrast enhancement of this lesion is seen following contrast, best demonstrated on the subtraction images. No new or enlarging lesions are identified.   Stomach/Bowel: The visualized portions of the stomach and small bowel appear unremarkable. Diverticular changes are again noted within the colon.   Vascular/Lymphatic: There are no enlarged abdominal lymph nodes. Aortic and branch vessel atherosclerosis without evidence of large vessel occlusion. The renal veins and IVC appear normal.   Other: No evidence of abdominal wall hernia or ascites.    Musculoskeletal: No acute or significant osseous findings.   IMPRESSION: 1. Stable complex mass in the interpolar region of the left kidney from previous study of 6 months ago, minimally enlarged from baseline study of 13 months ago. This lesion demonstrates no abnormal enhancement and remains most consistent with a complex hemorrhagic cyst. 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 2. No new or enlarging renal lesions. 3. Colonic diverticulosis.     Electronically Signed   By: William  Veazey M.D.   On: 04/02/2021 13:23  Assessment & Plan:   Right renal mass    No follow-ups on file.  I,Kailey Littlejohn,acting as a scribe for Ashley Brandon, MD.,have documented all relevant documentation on the behalf of Ashley Brandon, MD,as directed by  Ashley Brandon, MD while in the presence of Ashley Brandon, MD.   [facility] [address], [address] [place], [place] [address] [phone]  

## 2021-04-05 ENCOUNTER — Ambulatory Visit: Payer: Self-pay | Admitting: Urology

## 2021-04-06 ENCOUNTER — Telehealth: Payer: Self-pay | Admitting: *Deleted

## 2021-04-06 ENCOUNTER — Encounter: Payer: Self-pay | Admitting: Urology

## 2021-04-06 DIAGNOSIS — N2889 Other specified disorders of kidney and ureter: Secondary | ICD-10-CM

## 2021-04-06 NOTE — Telephone Encounter (Addendum)
Left Voicemail to return call  ----- Message from Vanna Scotland, MD sent at 04/05/2021  1:24 PM EDT ----- This patient was a no-show today for his visit.  He did however going get his MRI which shows a stably enlarged slightly irregular renal cyst.  Due to the complexity, he needs to continue to be followed.  If he is not having any symptoms, I think it is reasonable to just schedule him for next year with the same abdominal MRI with and without contrast prior to his appointment.  He is a questions or concerns about this, he is welcome to reschedule his follow-up which could be virtual if he prefers.  Otherwise, reschedule for in 1 year with an MRI prior.  Vanna Scotland, MD

## 2021-04-11 ENCOUNTER — Ambulatory Visit: Payer: Medicare Other | Admitting: Physician Assistant

## 2021-04-18 ENCOUNTER — Encounter: Payer: Self-pay | Admitting: *Deleted

## 2021-04-18 NOTE — Telephone Encounter (Signed)
Unable to reach patient-MRI ordered and follow up scheduled

## 2021-04-24 ENCOUNTER — Encounter: Payer: Self-pay | Admitting: Physician Assistant

## 2021-04-24 ENCOUNTER — Other Ambulatory Visit
Admission: RE | Admit: 2021-04-24 | Discharge: 2021-04-24 | Disposition: A | Discharge status: Home / Self Care | Payer: Medicare Other | Attending: Physician Assistant | Admitting: Physician Assistant

## 2021-04-24 ENCOUNTER — Ambulatory Visit: Payer: Medicare Other | Admitting: Physician Assistant

## 2021-04-24 ENCOUNTER — Other Ambulatory Visit: Payer: Self-pay

## 2021-04-24 VITALS — BP 130/66 | HR 66 | Ht 66.0 in | Wt 198.0 lb

## 2021-04-24 DIAGNOSIS — I10 Essential (primary) hypertension: Secondary | ICD-10-CM

## 2021-04-24 DIAGNOSIS — E785 Hyperlipidemia, unspecified: Secondary | ICD-10-CM

## 2021-04-24 DIAGNOSIS — I25118 Atherosclerotic heart disease of native coronary artery with other forms of angina pectoris: Secondary | ICD-10-CM

## 2021-04-24 DIAGNOSIS — I5022 Chronic systolic (congestive) heart failure: Secondary | ICD-10-CM

## 2021-04-24 DIAGNOSIS — R079 Chest pain, unspecified: Secondary | ICD-10-CM

## 2021-04-24 DIAGNOSIS — R9431 Abnormal electrocardiogram [ECG] [EKG]: Secondary | ICD-10-CM | POA: Diagnosis present

## 2021-04-24 DIAGNOSIS — K921 Melena: Secondary | ICD-10-CM

## 2021-04-24 LAB — BASIC METABOLIC PANEL
Anion gap: 7 (ref 5–15)
BUN: 21 mg/dL (ref 8–23)
CO2: 27 mmol/L (ref 22–32)
Calcium: 8.9 mg/dL (ref 8.9–10.3)
Chloride: 105 mmol/L (ref 98–111)
Creatinine, Ser: 1.56 mg/dL — ABNORMAL HIGH (ref 0.61–1.24)
GFR, Estimated: 47 mL/min — ABNORMAL LOW (ref >60)
Glucose, Bld: 144 mg/dL — ABNORMAL HIGH (ref 70–99)
Potassium: 3.8 mmol/L (ref 3.5–5.1)
Sodium: 139 mmol/L (ref 135–145)

## 2021-04-24 LAB — BRAIN NATRIURETIC PEPTIDE: B Natriuretic Peptide: 225.9 pg/mL — ABNORMAL HIGH (ref 0.0–100.0)

## 2021-04-24 LAB — CBC
HCT: 42.6 % (ref 39.0–52.0)
Hemoglobin: 13.6 g/dL (ref 13.0–17.0)
MCH: 28.8 pg (ref 26.0–34.0)
MCHC: 31.9 g/dL (ref 30.0–36.0)
MCV: 90.1 fL (ref 80.0–100.0)
Platelets: 153 10*3/uL (ref 150–400)
RBC: 4.73 MIL/uL (ref 4.22–5.81)
RDW: 14.9 % (ref 11.5–15.5)
WBC: 5.2 10*3/uL (ref 4.0–10.5)
nRBC: 0 % (ref 0.0–0.2)

## 2021-04-24 LAB — TROPONIN I (HIGH SENSITIVITY): Troponin I (High Sensitivity): 25 ng/L — ABNORMAL HIGH (ref <18)

## 2021-04-24 NOTE — Patient Instructions (Signed)
Medication Instructions:  - Your physician recommends that you continue on your current medications as directed. Please refer to the Current Medication list given to you today.  *If you need a refill on your cardiac medications before your next appointment, please call your pharmacy*   Lab Work: - Your physician recommends that you have lab work today: BMP/ CBC/ BNP/ Troponin  Sales executive at [facility] 1st desk on the right to check in (Registration)    If you have labs (blood work) drawn today and your tests are completely normal, you will receive your results only by: [employer] (if you have MyChart) OR A paper copy in the mail If you have any lab test that is abnormal or we need to change your treatment, we will call you to review the results.   Testing/Procedures: - You have been referred to : Electrophysiology (EP)- Dr. Graciela Husbands Dr. Lalla Brothers for evaluation of an ICD (defibrillator).     Follow-Up: At [facility], you and your health needs are our priority.  As part of our continuing mission to provide you with exceptional heart care, we have created designated Provider Care Teams.  These Care Teams include your primary Cardiologist (physician) and Advanced Practice Providers (APPs -  Physician Assistants and Nurse Practitioners) who all work together to provide you with the care you need, when you need it.  We recommend signing up for the patient portal called "MyChart".  Sign up information is provided on this After Visit Summary.  MyChart is used to connect with patients for Virtual Visits (Telemedicine).  Patients are able to view lab/test results, encounter notes, upcoming appointments, etc.  Non-urgent messages can be sent to your provider as well.   To learn more about what you can do with MyChart, go to [email or web address].    Your next appointment:   After EP appointment   The format for your next appointment:   In Person  Provider:   You may see  Yvonne Kendall, MD or one of the following Advanced Practice Providers on your designated Care Team:   Nicolasa Ducking, NP Eula Listen, PA-C Marisue Ivan, PA-C Cadence Fransico Michael, [place]   Other Instructions N/a

## 2021-04-24 NOTE — Progress Notes (Signed)
Office Visit    Patient Name: Donald Skinner Date of Encounter: 04/24/2021  PCP:  Lesleigh Noe, MD   [place]  Cardiologist:  Nelva Bush, MD  Advanced Practice Provider:  No care team member to display Electrophysiologist:  None :[phone]}   Chief Complaint    Chief Complaint  Patient presents with   Follow-up    Patient reports black stools x 3 days    70 y.o. male with history of CAD s/p recent right and left heart catheterization, ischemic cardiomyopathy with LVEF 35 to 40% by echo 07/2016, hypertension, DM2, and here today for follow-up after echo.  Past Medical History    Past Medical History:  Diagnosis Date   Allergy    Diabetes mellitus without complication ([place])    Heart disease    Hyperlipidemia    Hypertension    Myocardial infarction [facility])    Past Surgical History:  Procedure Laterality Date   CARDIAC CATHETERIZATION     Cardiac stents     x 6 stents- had 5 heart attacks   RIGHT/LEFT HEART CATH AND CORONARY ANGIOGRAPHY N/A 01/06/2021   Procedure: RIGHT/LEFT HEART CATH AND CORONARY ANGIOGRAPHY;  Surgeon: Nelva Bush, MD;  Location: [facility];  Service: Cardiovascular;  Laterality: N/A;    Allergies  Allergies  Allergen Reactions   Imdur [Isosorbide Nitrate]     headache   Lipitor [Atorvastatin]     myalgia   Losartan Potassium Swelling    Tongue swelling   Lisinopril Cough   Rosuvastatin Other (See Comments)    myalgias   Simvastatin Other (See Comments)    weakness    History of Present Illness    Donald Skinner is a 70 y.o. male with PMH as above.  He has history of CAD s/p recent right and left heart catheterization, ICM with LVEF 35 to 40% by echo 07/2016 and 30 to 35% x 02/2021 echo, hypertension, and DM2.  Initial PCI to LAD in 2003.  Subsequent PCI to proximal LAD and mid left circumflex 06/2005.  In 02/2010, he underwent PCI to proximal LAD for ISR (he declined single-vessel CABG).  He had  PCI to proximal left circumflex 08/2012 in the setting of non-STEMI.  Later that year 12/2012, he underwent PCI to proximal right coronary artery in the setting of STEMI.  07/2016 catheterization in the setting of non-STEMI showed severe multivessel CAD not amenable to PCI or CABG.  Echo at that time showed LVEF 35 to 40%, moderate MR, normal diastolic function, normal PA pressure.    He was seen by his PCP 11/22/2020 and started on HCTZ for hypertension.  He noted a 24-hour history of TIA-like symptoms with paresthesias and heaviness, subsequently self resolving.  He was recommended for neurology referral.  He was seen 11/30/2020 by Laurann Montana, NP and reported his blood pressure was still elevated and greater than 130/80 at home, though he had not yet picked up his HCTZ.  He reported new dyspnea on exertion over the last month.  He reported it as likely due to his TIA episodes.  He was previously walking regularly for exercise.  He also noted chest tightness with exertion, as well as dyspnea.  Stress test was recommended at that time.  Subsequent stress test was abnormal with hypertensive response and recommendation to proceed with cardiac catheterization.  Overall, it was ruled a high risk study.  EF 30%.  He underwent 01/06/2021 catheterization that showed severe three-vessel CAD predominantly affecting  the distal LAD and left circumflex/OM 3, as well as chronic total occlusion of the proximal RCA.  Findings were noted to be similar to outside catheterization report from 2018.  He had patent stents of the mid LAD and mid to distal left circumflex with mild in-stent restenosis.  Normal left and right heart filling pressures were noted.  He had borderline elevated PASP with elevated pulmonary vascular resistance.  Moderately reduced Fick CO/CI noted.  No interventional targets were appreciated with overall appearance similar to that of 2018.    Escalation of GDMT has been complicated by renal function and  intolerance/allergies.  Repeat echo on maximum tolerated medical therapy after catheterization 03/01/2021 showed EF continued to be less than 35%, as discussed today, and with recommendation for ICD for primary prevention as below.  He has been unable to tolerate Imdur  due to extreme headache with this medication.  Angioedema has precluded addition of Entresto.   Today, 04/24/2021, he returns to clinic and reports a recent episode of chest pain that occurred while walking yesterday and alleviated approximately 1 hour after resting.  Chest pain was described as discomfort in both his chest and back.  Since that time, he denies recurrence.  He reports his energy has been okay.  He reports his breathing is stable from previous visits.  He sleeps throughout the day, though he attributes this to a history of working the night shift and abnormal sleep/wake cycle as a result.  He does note some lightheadedness that has persisted.  He wonders about dehydration, given that he has had lots of diarrhea lately.  He has been taking lots of Pepto-Bismol and has subsequently also had a dark stool.  He states that he was lightheaded walking into clinic today.  No syncope or recent falls.  He has some dry cough that he attributed to allergies.  He denies orthopnea.  No PND.  He does note early satiety.  He does admit to increased fluid and salt intake in the past few weeks, including increased salt intake when on a cruise recently.  Home Medications   Current Outpatient Medications  Medication Instructions   albuterol (VENTOLIN HFA) 108 (90 Base) MCG/ACT inhaler 2 puffs, Inhalation, Every 6 hours PRN   aspirin EC 81 mg, Oral, Daily, Swallow whole.   atorvastatin (LIPITOR) 80 mg, Oral, Daily   bismuth subsalicylate (PEPTO BISMOL) 262 MG/15ML suspension 30 mLs, Oral, Every 6 hours PRN   blood glucose meter kit and supplies Dispense based on patient and insurance preference. Use up to four times daily as directed. (FOR ICD-10  E10.9, E11.9).   carvedilol (COREG) 25 MG tablet TAKE 1 TABLET(25 MG) BY MOUTH TWICE DAILY WITH A MEAL   clopidogrel (PLAVIX) 75 MG tablet TAKE 1 TABLET(75 MG) BY MOUTH DAILY   dapagliflozin propanediol (FARXIGA) 5 mg, Oral, Daily before breakfast   hydrochlorothiazide (HYDRODIURIL) 25 mg, Oral, Daily   metFORMIN (GLUCOPHAGE) 500 MG tablet TAKE 1 TABLET(500 MG) BY MOUTH TWICE DAILY WITH A MEAL   nitroGLYCERIN (NITROSTAT) 0.4 mg, Sublingual, Every 5 min PRN   pioglitazone (ACTOS) 45 MG tablet TAKE 1 TABLET(45 MG) BY MOUTH DAILY     Review of Systems    He reports an episode of chest pain with ambulation yesterday that has not recurred.  He has some lightheadedness.  He has diarrhea and dark stool, though in the setting of taking Pepto-Bismol.  He reports a persistent dry cough.  He reports stable dyspnea/breathing when compared with previous visits.  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  He notes early satiety.  He denies palpitations, pnd, orthopnea, n, v, syncope, edema, weight gain.  All other systems reviewed and are otherwise negative except as noted above.  Physical Exam    VS:  BP 130/66 (BP Location: Left Arm, Patient Position: Sitting, Cuff Size: Large)   Pulse 66   Ht 5' 6" (1.676 m)   Wt 198 lb (89.8 kg)   SpO2 96%   BMI 31.96 kg/m  , BMI Body mass index is 31.96 kg/m. GEN: Well nourished, well developed, in no acute distress. HEENT: normal. Neck: Supple, no JVD, carotid bruits, or masses. Cardiac: RRR, no murmurs, rubs, or gallops. No clubbing, cyanosis, or pitting edema.  Radials/DP/PT 2+ and equal bilaterally.   Respiratory:  Respirations regular and unlabored, bibasilar crackles. GI: nondistended, BS + x 4. MS: no deformity or atrophy. Skin: warm and dry, no rash. Neuro:  Strength and sensation are intact. Psych: Normal affect.  Accessory Clinical Findings    ECG personally reviewed by me today -NSR, 66 bpm,  IVCD with repolarization abnormalities, T wave inversion in lateral leads as seen in prior EKGs, ST segment more concave and anterior precordial leads as reviewed with DOD same day  VITALS Reviewed today   Temp Readings from Last 3 Encounters:  01/06/21 98.1 F (36.7 C) (Oral)  12/22/20 (!) 97.3 F (36.3 C) (Temporal)  11/22/20 97.9 F (36.6 C) (Temporal)   BP Readings from Last 3 Encounters:  04/24/21 130/66  01/13/21 138/70  01/06/21 140/73   Pulse Readings from Last 3 Encounters:  04/24/21 66  01/13/21 67  01/06/21 66    Wt Readings from Last 3 Encounters:  04/24/21 198 lb (89.8 kg)  01/13/21 187 lb 9.6 oz (85.1 kg)  01/06/21 196 lb (88.9 kg)     LABS  reviewed today   Lab Results  Component Value Date   WBC 4.6 12/23/2020   HGB 14.1 12/23/2020   HCT 43.5 12/23/2020   MCV 87.7 12/23/2020   PLT 146 (L) 12/23/2020   Lab Results  Component Value Date   CREATININE 1.60 (H) 01/23/2021   BUN 27 (H) 01/23/2021   NA 134 (L) 01/23/2021   K 4.0 01/23/2021   CL 104 01/23/2021   CO2 24 01/23/2021   Lab Results  Component Value Date   ALT 15 11/22/2020   AST 15 11/22/2020   ALKPHOS 59 11/22/2020   BILITOT 0.8 11/22/2020   Lab Results  Component Value Date   CHOL 129 09/13/2020   HDL 53.80 09/13/2020   LDLCALC 63 09/13/2020   TRIG 62.0 09/13/2020   CHOLHDL 2 09/13/2020    Lab Results  Component Value Date   HGBA1C 7.2 (A) 12/22/2020   Lab Results  Component Value Date   TSH 0.67 09/29/2019     STUDIES/PROCEDURES reviewed today   Echo 02/28/2021  1. Left ventricular ejection fraction, by estimation, is 30 to 35%. The  left ventricle has moderate to severely decreased function. The left  ventricle demonstrates regional wall motion abnormalities (see scoring  diagram/findings for description). There  is mild left ventricular hypertrophy. Left ventricular diastolic  parameters are consistent with Grade I diastolic dysfunction (impaired  relaxation).  There is severe hypokinesis of the left ventricular, entire  inferolateral wall.   2. Right ventricular systolic function is normal. The right ventricular  size is normal.   3. The mitral valve is grossly normal.  Trivial mitral valve  regurgitation.   4. The aortic valve is tricuspid. Aortic valve regurgitation is not  visualized. Mild aortic valve sclerosis is present, with no evidence of  aortic valve stenosis.   LHC 01/06/21 Coronary Findings   Diagnostic Dominance: Right  Left Main  Vessel is large. Vessel is angiographically normal.  Left Anterior Descending  Mid LAD lesion is 20% stenosed. The lesion was previously treatedover 2 years ago. Previously placed stent displays restenosis.  Dist LAD lesion is 100% stenosed. The lesion is chronically occluded.  First Diagonal Branch  Vessel is moderate in size.  1st Diag lesion is 40% stenosed.  Second Diagonal Branch  Vessel is small in size.  2nd Diag lesion is 70% stenosed.  Third Diagonal Branch  Vessel is small in size.  Left Circumflex  Vessel is large.  Dist Cx lesion is 10% stenosed. The lesion was previously treatedover 2 years ago. Previously placed stent displays restenosis.  First Obtuse Marginal Branch  Vessel is small in size.  1st Mrg lesion is 40% stenosed.  Second Obtuse Marginal Branch  Vessel is small in size.  2nd Mrg lesion is 50% stenosed.  Third Obtuse Marginal Branch  Vessel is moderate in size. There is moderate disease in the vessel.  3rd Mrg lesion is 95% stenosed.  Right Coronary Artery  Vessel is moderate in size.  Prox RCA lesion is 100% stenosed. The lesion is chronically occluded. The lesion was previously treatedover 2 years ago. Previously placed stent displays restenosis.  Right Posterior Descending Artery  Collaterals  RPDA filled by collaterals from 1st Sept.     Intervention   No interventions have been documented.  Right Heart  Right Heart Pressures RA (mean): 6 mmHg RV  (S/EDP): 40/7 mmHg PA (S/D, mean): 40/16 (23) mmHg PCWP (mean): 10 mmHg  Ao sat: 98% PA sat: 66%  Fick CO: 3.7 L/min Fick CI: 1.9 L/min/m^2  PVR: 3.5 Wood units   Left Heart  Left Ventricle LV end diastolic pressure is normal. LVEDP 11 mmHg.  Aortic Valve There is no aortic valve stenosis.   Coronary Diagrams   Diagnostic Dominance: Right    Intervention    Conclusions: Severe three-vessel coronary artery disease predominantly affecting the distal LAD and LCx/OM3 as well as chronic total occlusion of proximal RCA.  Findings appear to be similar to outside catheterization report from 2018. Patent stents in the mid LAD and mid/distal LCx with mild in-stent restenosis. Normal left and right heart filling pressures. Borderline elevated pulmonary artery pressure with elevated pulmonary vascular resistance. Moderately reduced Fick cardiac output/index. Recommendations: No interventional targets appreciated with overall appearance similar to catheterization report from outside hospital in 2018.  I will add isosorbide mononitrate 30 mg daily for antianginal therapy. Continue goal-directed medical therapy for chronic HFrEF due to ischemic cardiomyopathy.  Medications should be escalated as tolerated. Basic metabolic panel early next week to reassess renal function.  Defer restarting metformin pending BMP. Discussed repeat transthoracic echocardiogram at follow-up visit.  If LVEF is less than 35%, ICD for primary prevention will need to be considered.  Stress test 12/12/2020 Blood pressure demonstrated a hypertensive response to exercise. EKG is not interpretable with stress due to baseline abnormalities. Defect 1: There is a large defect of severe severity present in the basal inferoseptal, basal inferior, basal inferolateral, mid inferoseptal, mid inferior, mid inferolateral and apical inferior location. Findings consistent with prior myocardial infarction with significant  peri-infarct ischemia. This is a high risk study. Nuclear stress EF:  30%.  Assessment & Plan    Coronary artery disease -- Reports an episode of chest pain with walking yesterday, concerning for angina.  EKG today shows ST changes in the anterior leads, new from previous tracings, and discussed with DOD same day.  He has history of CAD s/p 12/2020 cath and -up 02/2021 echo with persistent EF below 35%.  Given his above history, symptoms, and EKG findings, and after discussion of case with DOD, HS Tn ordered and resulted today at 25.  Plan as discussed with team is that he will continue to monitor his symptoms at home and call the office if any recurrent symptoms.  Ongoing aggressive risk factor modification recommended with strict control of LDL and ongoing lifestyle changes recommended with diet and exercise.  Continue ASA, Plavix, carvedilol, atorvastatin.  Unable to tolerate Imdur.  HFrEF/ischemic cardiomyopathy -- Appears mildly volume up on exam, likely in the setting of his recent cruise and increase in salt and fluid intake.  Suspect volume status will improve with return of his home diet.  Given his h/o poor renal function and recent diarrhea, will defer changes to his diuretic/HCTZ at this time.  Ordered BMET and BNP to trend with further recommendations as indicated once resulted.  For now, continue current carvedilol, Norvasc, HCTZ. He is unable to tolerate ACE/ARB/Entresto given renal function and angioedema.  He is unable to tolerate Imdur due to headache.  Given echo 8/22 EF shows LV SF remains 30 to 35%, EP referral provided today for formal consideration of ICD for primary prevention.  Continue to monitor fluid and salt intake..  Essential hypertension, goal BP less than 130/80 - BP today borderline.  BMET ordered as above.  For now, continue current doses of HCTZ, carvedilol 25 mg twice daily, and amlodipine 10 mg daily.  He is unable to tolerate ACE/ARB/spironolactone/Entresto/Imdur.   Continue Farxiga.  Salt and fluid restrictions reviewed.  HLD, goal LDL below 70 --Continue atorvastatin 80 mg daily.  Melena/diarrhea --Reports recent melena, though in the setting of heavy Pepto-Bismol use 2/2 recent diarrhea.  Will check BMET to ensure kidney function and electrolytes stable, as well as CBC to ensure stable H&H.  Medication changes: None Labs ordered: High-sensitivity troponin (stat), BMET, BNP, CBC Studies / Imaging/referrals ordered: Electrophysiology Disposition: RTC after EP visit  *Please be aware that the above documentation was completed voice recognition software and may contain dictation errors.    Arvil Chaco, PA-C 04/24/2021

## 2021-04-28 ENCOUNTER — Telehealth: Payer: Self-pay | Admitting: Physician Assistant

## 2021-04-28 DIAGNOSIS — I5022 Chronic systolic (congestive) heart failure: Secondary | ICD-10-CM

## 2021-04-28 DIAGNOSIS — Z79899 Other long term (current) drug therapy: Secondary | ICD-10-CM

## 2021-04-28 MED ORDER — FUROSEMIDE 20 MG PO TABS: ORAL_TABLET | ORAL | 1 refills | Status: DC

## 2021-04-28 MED ORDER — POTASSIUM CHLORIDE ER 10 MEQ PO TBCR: EXTENDED_RELEASE_TABLET | ORAL | 1 refills | Status: DC

## 2021-04-28 NOTE — Telephone Encounter (Signed)
Attempted to call the patient. No answer- I left a message to please call back.  

## 2021-04-28 NOTE — Telephone Encounter (Signed)
Lmov to confirm scheduled time for 10/7 1045 am labs

## 2021-04-28 NOTE — Telephone Encounter (Signed)
I spoke with the patient regarding his lab results and Marisue Ivan, PA's recommendations to:  1) STOP HCTZ 2) START lasix 20 mg: - take 2 tablets (40 mg) once daily x 3 days, then - take 1 tablet (20 mg) once daily 3) START potassium 10 meq: - take 2 tablets (20 meq) once daily x 3 days, then - take 1 tablet (10 meq) once daily 4) repeat a BMP/ BNP in ~ 1 weeks time  The patient was in the car driving and unable to write this down. He did voice understanding, but I advised I would call back and if he would let this go to his voice mail, I would leave a detailed message again of all of the above.  He is aware I will ask scheduling to reach out to him to arrange for a repeat BMP/ BNP in ~ 1 week.  The patient was agreeable.  I did call him back and left a detailed message of all of the above on his identified voice mail.  RX sent to the pharmacy. Message to schedule to please arrange repeat labs- orders placed.

## 2021-04-28 NOTE — Telephone Encounter (Signed)
Patient is returning call for results. 

## 2021-04-28 NOTE — Telephone Encounter (Signed)
In regards to the patient's troponin: Lennon Alstrom, PA-C  04/24/2021  3:03 PM EDT Back to Top    Reviewed with DOD as case also discussed with DOD earlier today. Per DOD, Tn elevation not concerning based on discussed case. Called pt and unable to reach. Will continue to attempt to reach pt.    In regards to all additional labs done: Lennon Alstrom, Cordelia Poche  04/27/2021  7:44 AM EDT     Labs suggest he is holding on to fluid --Discontinue HCTZ  --Start lasix 20mg  daily  --Start KCL tab daily --For first 3 days: lasix 40mg  daily & KCL tab daily --Repeat BMET and BNP in 1 week

## 2021-05-05 ENCOUNTER — Other Ambulatory Visit (INDEPENDENT_AMBULATORY_CARE_PROVIDER_SITE_OTHER): Payer: Medicare Other

## 2021-05-05 ENCOUNTER — Other Ambulatory Visit: Payer: Self-pay

## 2021-05-05 DIAGNOSIS — I5022 Chronic systolic (congestive) heart failure: Secondary | ICD-10-CM

## 2021-05-05 DIAGNOSIS — Z79899 Other long term (current) drug therapy: Secondary | ICD-10-CM

## 2021-05-06 LAB — BASIC METABOLIC PANEL
BUN/Creatinine Ratio: 15 (ref 10–24)
BUN: 22 mg/dL (ref 8–27)
CO2: 19 mmol/L — ABNORMAL LOW (ref 20–29)
Calcium: 9.2 mg/dL (ref 8.6–10.2)
Chloride: 105 mmol/L (ref 96–106)
Creatinine, Ser: 1.47 mg/dL — ABNORMAL HIGH (ref 0.76–1.27)
Glucose: 248 mg/dL — ABNORMAL HIGH (ref 70–99)
Potassium: 4.2 mmol/L (ref 3.5–5.2)
Sodium: 142 mmol/L (ref 134–144)
eGFR: 50 mL/min/{1.73_m2} — ABNORMAL LOW (ref >59)

## 2021-05-06 LAB — BRAIN NATRIURETIC PEPTIDE: BNP: 195 pg/mL — ABNORMAL HIGH (ref 0.0–100.0)

## 2021-05-10 ENCOUNTER — Telehealth: Payer: Self-pay | Admitting: Physician Assistant

## 2021-05-10 NOTE — Telephone Encounter (Signed)
Attempted to call the patient. No answer- I left a detailed message of results on his identified voice mail (ok per DPR).  I asked that he call back with any questions and advised to keep his appointments on 10/19- Dr. Lalla Brothers & 10/21Harvel Quale, PA.

## 2021-05-10 NOTE — Telephone Encounter (Signed)
Lennon Alstrom, PA-C  05/09/2021  3:24 PM EDT     Labs show stable kidney function and K+ after most recent change in diuresis. Fluid lab still suggests he is volume up, though improving from previous.  Recommend he monitor wt and BP closely at home. Call the office if sx of overload with associated wt gain 3 lbs overnight or 5lbs in 1 week    Keep follow-up as scheduled on 10/21.

## 2021-05-16 NOTE — Progress Notes (Signed)
Electrophysiology Office Note:    Date:  05/17/2021   ID:  Donald Skinner, DOB January 24, 1951, MRN [id number]  PCP:  Lesleigh Noe, MD  [facility] Cardiologist:  Nelva Bush, MD  [facility] Electrophysiologist:  Vickie Epley, MD   Referring MD: Arvil Chaco, PA*   Chief Complaint: Ischemic cardiomyopathy  History of Present Illness:    Donald Skinner is a 70 y.o. male who presents for an evaluation of ischemic cardiomyopathy at the request of Lorenso Quarry. Their medical history includes diabetes, hypertension, hyperlipidemia and coronary artery disease post 6 PCI's.  The patient was last seen by Lorenso Quarry on April 24, 2021.     Past Medical History:  Diagnosis Date   Allergy    Diabetes mellitus without complication ([place])    Heart disease    Hyperlipidemia    Hypertension    Myocardial infarction [facility])     Past Surgical History:  Procedure Laterality Date   CARDIAC CATHETERIZATION     Cardiac stents     x 6 stents- had 5 heart attacks   RIGHT/LEFT HEART CATH AND CORONARY ANGIOGRAPHY N/A 01/06/2021   Procedure: RIGHT/LEFT HEART CATH AND CORONARY ANGIOGRAPHY;  Surgeon: Nelva Bush, MD;  Location: [facility];  Service: Cardiovascular;  Laterality: N/A;    Current Medications: Current Meds  Medication Sig   aspirin EC 81 MG tablet Take 81 mg by mouth daily. Swallow whole.   atorvastatin (LIPITOR) 80 MG tablet Take 1 tablet (80 mg total) by mouth daily.   blood glucose meter kit and supplies Dispense based on patient and insurance preference. Use up to four times daily as directed. (FOR ICD-10 E10.9, E11.9).   carvedilol (COREG) 25 MG tablet TAKE 1 TABLET(25 MG) BY MOUTH TWICE DAILY WITH A MEAL   clopidogrel (PLAVIX) 75 MG tablet TAKE 1 TABLET(75 MG) BY MOUTH DAILY   dapagliflozin propanediol (FARXIGA) 5 MG TABS tablet Take 1 tablet (5 mg total) by mouth daily before breakfast.   furosemide (LASIX) 20 MG tablet Take 1 tablet  (20 mg) by mouth once daily as directed   metFORMIN (GLUCOPHAGE) 500 MG tablet TAKE 1 TABLET(500 MG) BY MOUTH TWICE DAILY WITH A MEAL   nitroGLYCERIN (NITROSTAT) 0.4 MG SL tablet Place 0.4 mg under the tongue every 5 (five) minutes as needed for chest pain.   pioglitazone (ACTOS) 45 MG tablet TAKE 1 TABLET(45 MG) BY MOUTH DAILY   potassium chloride (KLOR-CON) 10 MEQ tablet Take 1 tablet (10 meq) by mouth once daily as directed     Allergies:   Imdur [isosorbide nitrate], Lipitor [atorvastatin], Losartan potassium, Lisinopril, Rosuvastatin, and Simvastatin   Social History   Socioeconomic History   Marital status: Divorced    Spouse name: Not on file   Number of children: 4   Years of education: trade school   Highest education level: Not on file  Occupational History   Not on file  Tobacco Use   Smoking status: Never   Smokeless tobacco: Never  Vaping Use   Vaping Use: Never used  Substance and Sexual Activity   Alcohol use: Not Currently   Drug use: Never   Sexual activity: Not Currently  Other Topics Concern   Not on file  Social History Narrative   12/07/19   From: near [place], [place] - moved to be near to daughter   Living: moving out of his daughters - will be living alone   Work: retired due to disability from Engineer, 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      Family: daughter - Roland Rack nearby (nurse), Noni Saupe, has 8 grandchildren      Enjoys: travel, sailing, boating      Exercise: not currently - was walking and bicycling   Diet: has been gaining weight, not doing good with diet - tries to do low carb      Safety   Seat belts: Yes    Guns: No   Safe in relationships: Yes    Social Determinants of Radio broadcast assistant Strain: Not on file  Food Insecurity: Not on file  Transportation Needs: Not on file  Physical Activity: Not on file  Stress: Not on file  Social Connections: Not on file     Family History: The patient's family history  includes Alcohol abuse in his brother, brother, father, and mother; Arthritis in his mother; Drug abuse in his brother; Heart attack (age of onset: 89) in his father; Hypertension in his father; Mental illness in his brother.  ROS:   Please see the history of present illness.    All other systems reviewed and are negative.  EKGs/Labs/Other Studies Reviewed:    The following studies were reviewed today:  March 01, 2021 echo personally reviewed Left ventricular function moderate to severely decreased, 30 to 35% Right ventricular function normal Trivial MR   January 06, 2021 left heart catheterization Conclusions: Severe three-vessel coronary artery disease predominantly affecting the distal LAD and LCx/OM3 as well as chronic total occlusion of proximal RCA.  Findings appear to be similar to outside catheterization report from 2018. Patent stents in the mid LAD and mid/distal LCx with mild in-stent restenosis. Normal left and right heart filling pressures. Borderline elevated pulmonary artery pressure with elevated pulmonary vascular resistance. Moderately reduced Fick cardiac output/index.   EKG shows narrow QRS  Outside records about his coronary artery disease history: 1. CAD, status post mid-LAD stenting February '03.  a. Status post proximal LAD and mid circumflex stenting, December 2006.  b. Status post proximal LAD stenting inside stent for restenosis 03/15/2010.  c. Offered single vessel bypass of the LAD given recurrent LAD stenoses. The patient declined.  d. NSTEMI, S/P proximal circumflex DSE (Xience 3x41m) for a 95% stenosis September 11, 2012 e. STEMI occluded RCA s/p 2.5 x 18 mm DES post dilated to 374mJune 26, 2014. Remaining coronary vasculature demonstrating patent mid LAD stent. Prior stent to the mid left circumflex appearing patent. L1-2 with ostial 90% stenosis, the mid vessel lesion of 90%.. L1 3 with 90% ostial vessel stenosis. RCA with proximal 100% he noticed  stenosis stented to 0% residual. f. Nuclear stress test September 10, 2014 demonstrates at least moderate inferior scar with minimal inferoapical ischemia. g. 2 mm ST segment elevation in V V2 and V3 without correlating troponin elevation prompting emergent angiographic assessment on 08/27/2016 demonstrating normal left main, mid diffuse 30% LAD stenosis, distal diffuse 75% stenosis. D1 with proximal diffuse 95% stenosis, vessel less than 2 mm. Ramus intermedius containing ostial and proximal 90% stenosis with diffusely diseased vessel less than 2 mm. Nondominant left circumflex with stent patent in the proximal vessel, stent patent in the mid vessel and severe diffuse 99% stenosis to the distal vessel less than 2 mm in diameter. OM1, OM 2 small in caliber. RCA with proximal 100% occlusion. Right posterior-lateral branches filling via left-to-right collaterals. Not deemed appropriate candidate for either PCI or CABG secondary to severe diffusely diseased vessels.     Recent Labs: 11/22/2020: ALT 15  04/24/2021: Hemoglobin 13.6; Platelets 153 05/05/2021: BNP 195.0; BUN 22; Creatinine, Ser 1.47; Potassium 4.2; Sodium 142  Recent Lipid Panel    Component Value Date/Time   CHOL 129 09/13/2020 1028   TRIG 62.0 09/13/2020 1028   HDL 53.80 09/13/2020 1028   CHOLHDL 2 09/13/2020 1028   VLDL 12.4 09/13/2020 1028   LDLCALC 63 09/13/2020 1028    Physical Exam:    VS:  BP (!) 144/80 (BP Location: Left Arm, Patient Position: Sitting, Cuff Size: Normal)   Pulse 72   Ht 5' 6" (1.676 m)   Wt 190 lb (86.2 kg)   SpO2 98%   BMI 30.67 kg/m     Wt Readings from Last 3 Encounters:  05/17/21 190 lb (86.2 kg)  04/24/21 198 lb (89.8 kg)  01/13/21 187 lb 9.6 oz (85.1 kg)     GEN:  Well nourished, well developed in no acute distress HEENT: Normal NECK: No JVD; No carotid bruits LYMPHATICS: No lymphadenopathy CARDIAC: RRR, no murmurs, rubs, gallops RESPIRATORY:  Clear to auscultation without rales, wheezing  or rhonchi  ABDOMEN: Soft, non-tender, non-distended MUSCULOSKELETAL:  No edema; No deformity  SKIN: Warm and dry NEUROLOGIC:  Alert and oriented x 3 PSYCHIATRIC:  Normal affect       ASSESSMENT:    1. Chronic systolic heart failure ([place])   2. Hypertension associated with diabetes ([place])   3. Coronary artery disease of native artery of native heart with stable angina pectoris (HCC)    PLAN:    In order of problems listed above:  #Chronic systolic heart failure Secondary to ischemic cardiomyopathy.  NYHA class II-III.  Warm and dry on exam today.  On good medical therapy with Coreg, Farxiga, Lasix.  He is allergic to ACE and ARB.  We discussed his increased risk for sudden cardiac death given his history of chronic systolic heart failure.  We discussed using an ICD to help mitigate some of this risk and he is interested in proceeding.  I discussed the ICD procedure in detail including the risk, recovery.  The patient has an ischemic CM (EF 30%), NYHA Class III CHF, and CAD.  He is referred by Dr End for risk stratification of sudden death and consideration of ICD implantation.  At this time, he meets criteria for ICD implantation for primary prevention of sudden death.  I have had a thorough discussion with the patient reviewing options.  The patient and their family (if available) have had opportunities to ask questions and have them answered. The patient and I have decided together through a shared decision making process to proceed with VVI ICD at this time.   Risks, benefits, alternatives to ICD implantation were discussed in detail with the patient today. The patient understands that the risks include but are not limited to bleeding, infection, pneumothorax, perforation, tamponade, vascular damage, renal failure, MI, stroke, death, inappropriate shocks, and lead dislodgement and wishes to proceed.  We will therefore schedule device implantation at the next available time.  Plan for a  Medtronic VVI ICD.  He will hold his Plavix for 3 days prior to the implant.     #Hypertension Continue current medical therapy.  #Coronary artery disease No ischemic symptoms today.  Last PCI appears to have been in 2014.  Stable heart catheterization results on June 2022 left heart cath.  Hold Plavix for 3 days prior to the implant.  Continue aspirin uninterrupted.   Total time spent with patient today 65 minutes. This includes reviewing records, evaluating  the patient and coordinating care.  Medication Adjustments/Labs and Tests Ordered: Current medicines are reviewed at length with the patient today.  Concerns regarding medicines are outlined above.  No orders of the defined types were placed in this encounter.  No orders of the defined types were placed in this encounter.    Signed, Hilton Cork. Quentin Ore, MD, [facility], [facility] 05/17/2021 2:18 PM    Electrophysiology [facility]

## 2021-05-17 ENCOUNTER — Other Ambulatory Visit: Payer: Self-pay

## 2021-05-17 ENCOUNTER — Ambulatory Visit (INDEPENDENT_AMBULATORY_CARE_PROVIDER_SITE_OTHER): Payer: Medicare Other | Admitting: Cardiology

## 2021-05-17 ENCOUNTER — Encounter: Payer: Self-pay | Admitting: Cardiology

## 2021-05-17 VITALS — BP 144/80 | HR 72 | Ht 66.0 in | Wt 190.0 lb

## 2021-05-17 DIAGNOSIS — I5022 Chronic systolic (congestive) heart failure: Secondary | ICD-10-CM | POA: Diagnosis not present

## 2021-05-17 DIAGNOSIS — N1831 Chronic kidney disease, stage 3a: Secondary | ICD-10-CM | POA: Diagnosis not present

## 2021-05-17 DIAGNOSIS — I25118 Atherosclerotic heart disease of native coronary artery with other forms of angina pectoris: Secondary | ICD-10-CM | POA: Diagnosis not present

## 2021-05-17 DIAGNOSIS — E1159 Type 2 diabetes mellitus with other circulatory complications: Secondary | ICD-10-CM | POA: Diagnosis not present

## 2021-05-17 DIAGNOSIS — I152 Hypertension secondary to endocrine disorders: Secondary | ICD-10-CM

## 2021-05-17 NOTE — Patient Instructions (Addendum)
Medication Instructions:  Your physician recommends that you continue on your current medications as directed. Please refer to the Current Medication list given to you today. *If you need a refill on your cardiac medications before your next appointment, please call your pharmacy*  Lab Work: You will get lab work today:  BMP and CBC  If you have labs (blood work) drawn today and your tests are completely normal, you will receive your results only by: MyChart Message (if you have MyChart) OR A paper copy in the mail If you have any lab test that is abnormal or we need to change your treatment, we will call you to review the results.  Testing/Procedures: Your physician has recommended that you have a defibrillator inserted. An implantable cardioverter defibrillator (ICD) is a small device that is placed in your chest or, in rare cases, your abdomen. This device uses electrical pulses or shocks to help control life-threatening, irregular heartbeats that could lead the heart to suddenly stop beating (sudden cardiac arrest). Leads are attached to the ICD that goes into your heart. This is done in the hospital and usually requires an overnight stay. Please see the instruction sheet given to you today for more information.  Follow-Up:  ICD implant June 07, 2021  Your next appointment:    You will follow up with the [facility] Device clinic 10-14 days after your procedure. - You will follow up with Dr. Lalla Brothers 91 days after your procedure.

## 2021-05-18 LAB — CBC WITH DIFFERENTIAL/PLATELET
Basophils Absolute: 0 10*3/uL (ref 0.0–0.2)
Basos: 0 %
EOS (ABSOLUTE): 0.2 10*3/uL (ref 0.0–0.4)
Eos: 3 %
Hematocrit: 43.8 % (ref 37.5–51.0)
Hemoglobin: 14.3 g/dL (ref 13.0–17.7)
Immature Grans (Abs): 0 10*3/uL (ref 0.0–0.1)
Immature Granulocytes: 0 %
Lymphocytes Absolute: 1.4 10*3/uL (ref 0.7–3.1)
Lymphs: 30 %
MCH: 28 pg (ref 26.6–33.0)
MCHC: 32.6 g/dL (ref 31.5–35.7)
MCV: 86 fL (ref 79–97)
Monocytes Absolute: 0.6 10*3/uL (ref 0.1–0.9)
Monocytes: 12 %
Neutrophils Absolute: 2.5 10*3/uL (ref 1.4–7.0)
Neutrophils: 55 %
Platelets: 165 10*3/uL (ref 150–450)
RBC: 5.1 x10E6/uL (ref 4.14–5.80)
RDW: 14.1 % (ref 11.6–15.4)
WBC: 4.6 10*3/uL (ref 3.4–10.8)

## 2021-05-18 LAB — BASIC METABOLIC PANEL
BUN/Creatinine Ratio: 12 (ref 10–24)
BUN: 18 mg/dL (ref 8–27)
CO2: 18 mmol/L — ABNORMAL LOW (ref 20–29)
Calcium: 9.3 mg/dL (ref 8.6–10.2)
Chloride: 103 mmol/L (ref 96–106)
Creatinine, Ser: 1.54 mg/dL — ABNORMAL HIGH (ref 0.76–1.27)
Glucose: 120 mg/dL — ABNORMAL HIGH (ref 70–99)
Potassium: 4.5 mmol/L (ref 3.5–5.2)
Sodium: 140 mmol/L (ref 134–144)
eGFR: 48 mL/min/{1.73_m2} — ABNORMAL LOW (ref >59)

## 2021-05-19 ENCOUNTER — Encounter: Payer: Self-pay | Admitting: Physician Assistant

## 2021-05-19 ENCOUNTER — Ambulatory Visit: Payer: Medicare Other | Admitting: Physician Assistant

## 2021-05-19 ENCOUNTER — Other Ambulatory Visit: Payer: Self-pay

## 2021-05-19 VITALS — BP 128/70 | HR 69 | Ht 65.5 in | Wt 192.4 lb

## 2021-05-19 DIAGNOSIS — I255 Ischemic cardiomyopathy: Secondary | ICD-10-CM

## 2021-05-19 DIAGNOSIS — I5022 Chronic systolic (congestive) heart failure: Secondary | ICD-10-CM

## 2021-05-19 DIAGNOSIS — I10 Essential (primary) hypertension: Secondary | ICD-10-CM

## 2021-05-19 DIAGNOSIS — N1831 Chronic kidney disease, stage 3a: Secondary | ICD-10-CM | POA: Diagnosis not present

## 2021-05-19 DIAGNOSIS — I429 Cardiomyopathy, unspecified: Secondary | ICD-10-CM

## 2021-05-19 DIAGNOSIS — E785 Hyperlipidemia, unspecified: Secondary | ICD-10-CM

## 2021-05-19 DIAGNOSIS — Z79899 Other long term (current) drug therapy: Secondary | ICD-10-CM

## 2021-05-19 DIAGNOSIS — I25118 Atherosclerotic heart disease of native coronary artery with other forms of angina pectoris: Secondary | ICD-10-CM

## 2021-05-19 NOTE — Patient Instructions (Signed)
Medication Instructions:  - Your physician has recommended you make the following change in your medication:   1) INCREASE lasix (furosemide) 20 mg: - take 2 tablets (40 mg) by mouth once daily x 2 days, then - resume 1 tablet (20 mg) by mouth once daily  2) INCREASE potassium 10 meq: - take 2 tablets (20 meq) by mouth once daily x 2 days, then - resume 1 tablet (10 meq) by mouth once daily  *If you need a refill on your cardiac medications before your next appointment, please call your pharmacy*   Lab Work: - Your physician recommends that you return for lab work in: 1 week- BMP  If you have labs (blood work) drawn today and your tests are completely normal, you will receive your results only by: MyChart Message (if you have MyChart) OR A paper copy in the mail If you have any lab test that is abnormal or we need to change your treatment, we will call you to review the results.   Testing/Procedures: - none ordered   Follow-Up: At [facility], you and your health needs are our priority.  As part of our continuing mission to provide you with exceptional heart care, we have created designated Provider Care Teams.  These Care Teams include your primary Cardiologist (physician) and Advanced Practice Providers (APPs -  Physician Assistants and Nurse Practitioners) who all work together to provide you with the care you need, when you need it.  We recommend signing up for the patient portal called "MyChart".  Sign up information is provided on this After Visit Summary.  MyChart is used to connect with patients for Virtual Visits (Telemedicine).  Patients are able to view lab/test results, encounter notes, upcoming appointments, etc.  Non-urgent messages can be sent to your provider as well.   To learn more about what you can do with MyChart, go to [email or web address].    Your next appointment:   3-4 week(s)  The format for your next appointment:   In Person  Provider:   You  may see Yvonne Kendall, MD or one of the following Advanced Practice Providers on your designated Care Team:   Nicolasa Ducking, NP Eula Listen, PA-C Marisue Ivan, PA-C Cadence Fransico Michael, [place]   Other Instructions N/a

## 2021-05-19 NOTE — Progress Notes (Signed)
Office Visit    Patient Name: Donald Skinner Date of Encounter: 05/19/2021  PCP:  Lesleigh Noe, [place]  Cardiologist:  Nelva Bush, MD  Advanced Practice Provider:  No care team member to display Electrophysiologist:  Vickie Epley, MD :[id number]}   Chief Complaint    Chief Complaint  Patient presents with   Other    F/u post EP visit c/o spot on his left side lower back bothering him concerned possible tick or tick bite. Meds reviewed verbally with pt.     70 y.o. male with history of CAD s/p recent right and left heart catheterization, ischemic cardiomyopathy with LVEF 35 to 40% by echo 07/2016, hypertension, DM2, and here today for follow-up after echo.  Past Medical History    Past Medical History:  Diagnosis Date   Allergy    Diabetes mellitus without complication ([place])    Heart disease    Hyperlipidemia    Hypertension    Myocardial infarction [facility])    Past Surgical History:  Procedure Laterality Date   CARDIAC CATHETERIZATION     Cardiac stents     x 6 stents- had 5 heart attacks   RIGHT/LEFT HEART CATH AND CORONARY ANGIOGRAPHY N/A 01/06/2021   Procedure: RIGHT/LEFT HEART CATH AND CORONARY ANGIOGRAPHY;  Surgeon: Nelva Bush, MD;  Location: [place];  Service: Cardiovascular;  Laterality: N/A;    Allergies  Allergies  Allergen Reactions   Imdur [Isosorbide Nitrate]     headache   Lipitor [Atorvastatin]     myalgia   Losartan Potassium Swelling    Tongue swelling   Lisinopril Cough   Rosuvastatin Other (See Comments)    myalgias   Simvastatin Other (See Comments)    weakness    History of Present Illness    Micheal Skinner is a 70 y.o. male with PMH as above.  He has history of CAD s/p recent right and left heart catheterization, ICM with LVEF 35 to 40% by echo 07/2016 and 30 to 35% x 02/2021 echo, hypertension, and DM2.  Initial PCI to LAD in 2003.  Subsequent PCI to proximal LAD and mid  left circumflex 06/2005.  In 02/2010, he underwent PCI to proximal LAD for ISR (he declined single-vessel CABG).  He had PCI to proximal left circumflex 08/2012 in the setting of non-STEMI.  Later that year 12/2012, he underwent PCI to proximal right coronary artery in the setting of STEMI.  07/2016 catheterization in the setting of non-STEMI showed severe multivessel CAD not amenable to PCI or CABG.  Echo at that time showed LVEF 35 to 40%, moderate MR, normal diastolic function, normal PA pressure.    He was seen by his PCP 11/22/2020 and started on HCTZ for hypertension.  He noted a 24-hour history of TIA-like symptoms with paresthesias and heaviness, subsequently self resolving.  He was recommended for neurology referral.  He was seen 11/30/2020 by Laurann Montana, NP and reported his blood pressure was still elevated and greater than 130/80 at home, though he had not yet picked up his HCTZ.  He reported new dyspnea on exertion over the last month.  He reported it as likely due to his TIA episodes.  He was previously walking regularly for exercise.  He also noted chest tightness with exertion, as well as dyspnea.  Stress test was recommended at that time.  Subsequent stress test was abnormal with hypertensive response and recommendation to proceed with cardiac catheterization.  Overall,  it was ruled a high risk study.  EF 30%.  He underwent 01/06/2021 catheterization that showed severe three-vessel CAD predominantly affecting the distal LAD and left circumflex/OM 3, as well as chronic total occlusion of the proximal RCA.  Findings were noted to be similar to outside catheterization report from 2018.  He had patent stents of the mid LAD and mid to distal left circumflex with mild in-stent restenosis.  Normal left and right heart filling pressures were noted.  He had borderline elevated PASP with elevated pulmonary vascular resistance.  Moderately reduced Fick CO/CI noted.  No interventional targets were  appreciated with overall appearance similar to that of 2018.    Escalation of GDMT has been complicated by renal function and intolerance/allergies.  Repeat echo on maximum tolerated medical therapy after catheterization 03/01/2021 showed EF continued to be less than 35%, as discussed today, and with recommendation for ICD for primary prevention as below.  He has been unable to tolerate Imdur  due to extreme headache with this medication.  Angioedema has precluded addition of Entresto.   Seen 04/24/2021 and referred to EP with recommendation for ICD implant, scheduled for 06/07/2021.  Today, 05/19/2021, he returns to clinic and notes ongoing chest pain that occurs while walking.  CP is still present in the chest and back.  He reports ongoing occasional dizziness with walking.  He is still sleeping throughout most of the day, though this is due to his work schedule.  His energy has improved from previous visits, however.  He continues to have diarrhea. After stopping Pepto-Bismol as we discussed at his last visit, he reports his stool is now lighter in color and normal colored.  He continues to have a cough, attributed to allergies, though now with sputum. He also has a runny nose.  He reports his energy has improved.  BP at home 120s - 170s.  DBP 80s to high 90s.  He reports orthopnea, sleeping at a 45 degree angle.  He reports some right back pain with an area of scabbing at that area and recommendation that he talk to his PCP.   Home Medications   Current Outpatient Medications  Medication Instructions   aspirin EC 81 mg, Oral, Daily, Swallow whole.   atorvastatin (LIPITOR) 80 mg, Oral, Daily   blood glucose meter kit and supplies Dispense based on patient and insurance preference. Use up to four times daily as directed. (FOR ICD-10 E10.9, E11.9).   carvedilol (COREG) 25 MG tablet TAKE 1 TABLET(25 MG) BY MOUTH TWICE DAILY WITH A MEAL   clopidogrel (PLAVIX) 75 MG tablet TAKE 1 TABLET(75 MG) BY MOUTH  DAILY   dapagliflozin propanediol (FARXIGA) 5 mg, Oral, Daily before breakfast   furosemide (LASIX) 20 MG tablet Take 1 tablet (20 mg) by mouth once daily as directed   metFORMIN (GLUCOPHAGE) 500 MG tablet TAKE 1 TABLET(500 MG) BY MOUTH TWICE DAILY WITH A MEAL   nitroGLYCERIN (NITROSTAT) 0.4 mg, Sublingual, Every 5 min PRN   pioglitazone (ACTOS) 45 MG tablet TAKE 1 TABLET(45 MG) BY MOUTH DAILY   potassium chloride (KLOR-CON) 10 MEQ tablet Take 1 tablet (10 meq) by mouth once daily as directed     Review of Systems    He reports an episode of chest pain with ambulation yesterday that has not recurred.  He has some lightheadedness.  He has diarrhea and dark stool, though in the setting of taking Pepto-Bismol.  He reports a persistent dry cough.  He reports stable dyspnea/breathing when compared with previous  visits.  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  He notes early satiety.  He denies palpitations, pnd, orthopnea, n, v, syncope, edema, weight gain.  All other systems reviewed and are otherwise negative except as noted above.  Physical Exam    VS:  BP 128/70 (BP Location: Left Arm, Patient Position: Sitting, Cuff Size: Normal)   Pulse 69   Ht 5' 5.5" (1.664 m)   Wt 192 lb 6 oz (87.3 kg)   SpO2 93%   BMI 31.53 kg/m  , BMI Body mass index is 31.53 kg/m. GEN: Well nourished, well developed, in no acute distress. HEENT: normal. Neck: Supple, no JVD, carotid bruits, or masses. Cardiac: RRR, no murmurs, rubs, or gallops. No clubbing, cyanosis, or pitting edema.  Radials/DP/PT 2+ and equal bilaterally.   Respiratory:  Respirations regular and unlabored, bibasilar crackles. GI: distended and firm, BS + x 4. MS: no deformity or atrophy. Skin: warm and dry, no rash. Neuro:  Strength and sensation are intact. Psych: Normal affect.  Accessory Clinical Findings    ECG personally reviewed by me today -NSR, 69 bpm, IVCD with  repolarization abnormalities, T wave inversion in lateral leads as seen in prior EKGs -    VITALS Reviewed today   Temp Readings from Last 3 Encounters:  01/06/21 98.1 F (36.7 C) (Oral)  12/22/20 (!) 97.3 F (36.3 C) (Temporal)  11/22/20 97.9 F (36.6 C) (Temporal)   BP Readings from Last 3 Encounters:  05/19/21 128/70  05/17/21 (!) 144/80  04/24/21 130/66   Pulse Readings from Last 3 Encounters:  05/19/21 69  05/17/21 72  04/24/21 66    Wt Readings from Last 3 Encounters:  05/19/21 192 lb 6 oz (87.3 kg)  05/17/21 190 lb (86.2 kg)  04/24/21 198 lb (89.8 kg)     LABS  reviewed today   Lab Results  Component Value Date   WBC 4.6 05/17/2021   HGB 14.3 05/17/2021   HCT 43.8 05/17/2021   MCV 86 05/17/2021   PLT 165 05/17/2021   Lab Results  Component Value Date   CREATININE 1.54 (H) 05/17/2021   BUN 18 05/17/2021   NA 140 05/17/2021   K 4.5 05/17/2021   CL 103 05/17/2021   CO2 18 (L) 05/17/2021   Lab Results  Component Value Date   ALT 15 11/22/2020   AST 15 11/22/2020   ALKPHOS 59 11/22/2020   BILITOT 0.8 11/22/2020   Lab Results  Component Value Date   CHOL 129 09/13/2020   HDL 53.80 09/13/2020   LDLCALC 63 09/13/2020   TRIG 62.0 09/13/2020   CHOLHDL 2 09/13/2020    Lab Results  Component Value Date   HGBA1C 7.2 (A) 12/22/2020   Lab Results  Component Value Date   TSH 0.67 09/29/2019     STUDIES/PROCEDURES reviewed today   Echo 02/28/2021  1. Left ventricular ejection fraction, by estimation, is 30 to 35%. The  left ventricle has moderate to severely decreased function. The left  ventricle demonstrates regional wall motion abnormalities (see scoring  diagram/findings for description). There  is mild left ventricular hypertrophy. Left ventricular diastolic  parameters are consistent with Grade I diastolic dysfunction (impaired  relaxation). There is severe hypokinesis of the left ventricular, entire  inferolateral wall.   2. Right  ventricular systolic function is normal. The right ventricular  size is normal.   3. The mitral valve is grossly normal. Trivial mitral valve  regurgitation.  4. The aortic valve is tricuspid. Aortic valve regurgitation is not  visualized. Mild aortic valve sclerosis is present, with no evidence of  aortic valve stenosis.   LHC 01/06/21 Coronary Findings   Diagnostic Dominance: Right  Left Main  Vessel is large. Vessel is angiographically normal.  Left Anterior Descending  Mid LAD lesion is 20% stenosed. The lesion was previously treatedover 2 years ago. Previously placed stent displays restenosis.  Dist LAD lesion is 100% stenosed. The lesion is chronically occluded.  First Diagonal Branch  Vessel is moderate in size.  1st Diag lesion is 40% stenosed.  Second Diagonal Branch  Vessel is small in size.  2nd Diag lesion is 70% stenosed.  Third Diagonal Branch  Vessel is small in size.  Left Circumflex  Vessel is large.  Dist Cx lesion is 10% stenosed. The lesion was previously treatedover 2 years ago. Previously placed stent displays restenosis.  First Obtuse Marginal Branch  Vessel is small in size.  1st Mrg lesion is 40% stenosed.  Second Obtuse Marginal Branch  Vessel is small in size.  2nd Mrg lesion is 50% stenosed.  Third Obtuse Marginal Branch  Vessel is moderate in size. There is moderate disease in the vessel.  3rd Mrg lesion is 95% stenosed.  Right Coronary Artery  Vessel is moderate in size.  Prox RCA lesion is 100% stenosed. The lesion is chronically occluded. The lesion was previously treatedover 2 years ago. Previously placed stent displays restenosis.  Right Posterior Descending Artery  Collaterals  RPDA filled by collaterals from 1st Sept.     Intervention   No interventions have been documented.  Right Heart  Right Heart Pressures RA (mean): 6 mmHg RV (S/EDP): 40/7 mmHg PA (S/D, mean): 40/16 (23) mmHg PCWP (mean): 10 mmHg  Ao sat: 98% PA  sat: 66%  Fick CO: 3.7 L/min Fick CI: 1.9 L/min/m^2  PVR: 3.5 Wood units   Left Heart  Left Ventricle LV end diastolic pressure is normal. LVEDP 11 mmHg.  Aortic Valve There is no aortic valve stenosis.   Coronary Diagrams   Diagnostic Dominance: Right    Intervention    Conclusions: Severe three-vessel coronary artery disease predominantly affecting the distal LAD and LCx/OM3 as well as chronic total occlusion of proximal RCA.  Findings appear to be similar to outside catheterization report from 2018. Patent stents in the mid LAD and mid/distal LCx with mild in-stent restenosis. Normal left and right heart filling pressures. Borderline elevated pulmonary artery pressure with elevated pulmonary vascular resistance. Moderately reduced Fick cardiac output/index. Recommendations: No interventional targets appreciated with overall appearance similar to catheterization report from outside hospital in 2018.  I will add isosorbide mononitrate 30 mg daily for antianginal therapy. Continue goal-directed medical therapy for chronic HFrEF due to ischemic cardiomyopathy.  Medications should be escalated as tolerated. Basic metabolic panel early next week to reassess renal function.  Defer restarting metformin pending BMP. Discussed repeat transthoracic echocardiogram at follow-up visit.  If LVEF is less than 35%, ICD for primary prevention will need to be considered.  Stress test 12/12/2020 Blood pressure demonstrated a hypertensive response to exercise. EKG is not interpretable with stress due to baseline abnormalities. Defect 1: There is a large defect of severe severity present in the basal inferoseptal, basal inferior, basal inferolateral, mid inferoseptal, mid inferior, mid inferolateral and apical inferior location. Findings consistent with prior myocardial infarction with significant peri-infarct ischemia. This is a high risk study. Nuclear stress EF: 30%.  Assessment & Plan  HFrEF/ischemic cardiomyopathy -- Reports ongoing symptoms of volume overload and appears volume up on exam.  Given his recent diarrhea, recommend gentle increase in diuresis with Lasix 40 mg x 2 days and KCl tab 10 M EQ x2 days and then return to his previous doses thereafter.  He has a recent BMET. Repeat in 1 week.  Continue current carvedilol and [place]. He is unable to tolerate ACE/ARB/Entresto given renal function and angioedema.  He is unable to tolerate Imdur due to headache.  Given echo 8/22 EF 30 to 35%, EP referral provided last visit with recommendation for ICD insertion 11/9 for primary prevention.   Coronary artery disease -- Reports ongoing/unchanged CP as at prior visits with EKG stable from previous.  He has history of CAD s/p 12/2020 cath and -up 02/2021 echo with persistent EF below 35%.  Ongoing aggressive risk factor modification recommended with strict control of LDL and ongoing lifestyle changes recommended with diet and exercise.  Continue ASA, Plavix, carvedilol, atorvastatin. PRN SL nitro for CP. Unable to tolerate Imdur.  Essential hypertension, goal BP less than 130/80 - Continue current medications. Reasonably well controlled today.  HLD, goal LDL below 70 --Continue atorvastatin 80 mg daily.  Melena/diarrhea --Melena resolved with less Pepto-Bismol use.  Especially in the setting of diarrhea, recheck BMET in 1 week to ensure kidney function and electrolytes stable after 2d increased diuresis.  Medication changes: Transient increase to Lasix 54m qd/KCL tab 227m x2 days then back to usual dose. Labs ordered: BMET now and in 1-2 weeks Studies / Imaging/referrals ordered: None - pending ICD insertion  Disposition: RTC after EP ICD insertion  *Please be aware that the above documentation was completed voice recognition software and may contain dictation errors.    JaArvil ChacoPA-C 05/19/2021

## 2021-05-21 ENCOUNTER — Encounter: Payer: Self-pay | Admitting: Physician Assistant

## 2021-05-25 ENCOUNTER — Other Ambulatory Visit: Payer: Self-pay

## 2021-05-25 ENCOUNTER — Other Ambulatory Visit (INDEPENDENT_AMBULATORY_CARE_PROVIDER_SITE_OTHER): Payer: Medicare Other

## 2021-05-25 DIAGNOSIS — Z79899 Other long term (current) drug therapy: Secondary | ICD-10-CM

## 2021-05-25 DIAGNOSIS — I255 Ischemic cardiomyopathy: Secondary | ICD-10-CM

## 2021-05-25 DIAGNOSIS — N1831 Chronic kidney disease, stage 3a: Secondary | ICD-10-CM

## 2021-05-25 DIAGNOSIS — I5022 Chronic systolic (congestive) heart failure: Secondary | ICD-10-CM

## 2021-05-26 LAB — BASIC METABOLIC PANEL
BUN/Creatinine Ratio: 14 (ref 10–24)
BUN: 24 mg/dL (ref 8–27)
CO2: 25 mmol/L (ref 20–29)
Calcium: 9.2 mg/dL (ref 8.6–10.2)
Chloride: 99 mmol/L (ref 96–106)
Creatinine, Ser: 1.74 mg/dL — ABNORMAL HIGH (ref 0.76–1.27)
Glucose: 285 mg/dL — ABNORMAL HIGH (ref 70–99)
Potassium: 4 mmol/L (ref 3.5–5.2)
Sodium: 138 mmol/L (ref 134–144)
eGFR: 41 mL/min/{1.73_m2} — ABNORMAL LOW (ref >59)

## 2021-06-07 ENCOUNTER — Ambulatory Visit
Admission: RE | Admit: 2021-06-07 | Discharge: 2021-06-07 | Disposition: A | Discharge status: Home / Self Care | Payer: Medicare Other | Attending: Cardiology | Admitting: Cardiology

## 2021-06-07 ENCOUNTER — Telehealth: Payer: Self-pay

## 2021-06-07 ENCOUNTER — Encounter
Admission: RE | Disposition: A | Discharge status: Home / Self Care | Payer: Self-pay | Source: Home / Self Care | Attending: Cardiology

## 2021-06-07 DIAGNOSIS — I429 Cardiomyopathy, unspecified: Secondary | ICD-10-CM

## 2021-06-07 DIAGNOSIS — I5022 Chronic systolic (congestive) heart failure: Secondary | ICD-10-CM

## 2021-06-07 SURGERY — ICD IMPLANT
Anesthesia: Moderate Sedation

## 2021-06-07 MED ORDER — SODIUM CHLORIDE 0.9 % IV SOLN: INTRAVENOUS | Status: DC

## 2021-06-07 MED ORDER — POVIDONE-IODINE 10 % EX SWAB: 2.0000 "application " | Freq: Once | CUTANEOUS | Status: DC

## 2021-06-07 MED ORDER — CHLORHEXIDINE GLUCONATE CLOTH 2 % EX PADS: 6.0000 | MEDICATED_PAD | Freq: Every day | CUTANEOUS | Status: DC

## 2021-06-07 MED ORDER — CEFAZOLIN SODIUM-DEXTROSE 2-4 GM/100ML-% IV SOLN: 2.0000 g | INTRAVENOUS | Status: DC

## 2021-06-07 MED ORDER — SODIUM CHLORIDE 0.9 % IV SOLN
80.0000 mg | INTRAVENOUS | Status: DC
  Filled 2021-06-07: qty 2

## 2021-06-07 MED ORDER — CHLORHEXIDINE GLUCONATE 4 % EX LIQD: 4.0000 "application " | Freq: Once | CUTANEOUS | Status: DC

## 2021-06-07 NOTE — Telephone Encounter (Signed)
L mom to schedule 

## 2021-06-07 NOTE — Telephone Encounter (Signed)
-----   Message from Wiliam Ke, RN sent at 06/07/2021  7:31 AM EST ----- Regarding: needs f/u with CL Pt needs a virtual phone visit in 4 weeks per Dr. Lalla Brothers to rediscuss ICD implant.    Thank you! Boneta Lucks

## 2021-06-07 NOTE — Progress Notes (Signed)
Patient and daughter at bedside talking with Dr Lalla Brothers about procedure with many questions. Procedure cancelled today, will be rescheduled per Dr Lovena Neighbours office.

## 2021-06-07 NOTE — Telephone Encounter (Signed)
Called patient and gave him the result note below from Ward Givens, and scheduled patient for a lab here in office in one week.  Patient verbalized understanding and agreed with plan.

## 2021-06-07 NOTE — Telephone Encounter (Signed)
-----   Message from Creig Hines, NP sent at 06/06/2021  6:15 PM EST ----- Kidney function slightly worse with a creatinine of 1.74.  Electrolytes are normal.  Rising creatinine may have been related to diarrhea that occurred prior to this lab draw and/or additional Lasix taken after October 21 visit.  Regardless, encourage adequate hydration and he will need a basic metabolic panel in 1 to 2 weeks to assess for stability.

## 2021-06-09 ENCOUNTER — Ambulatory Visit (INDEPENDENT_AMBULATORY_CARE_PROVIDER_SITE_OTHER): Payer: Medicare Other | Admitting: Medical

## 2021-06-09 ENCOUNTER — Other Ambulatory Visit: Payer: Self-pay | Admitting: Family Medicine

## 2021-06-09 ENCOUNTER — Other Ambulatory Visit: Payer: Self-pay

## 2021-06-09 ENCOUNTER — Encounter: Payer: Self-pay | Admitting: Medical

## 2021-06-09 VITALS — BP 152/74 | HR 83 | Ht 66.0 in | Wt 194.0 lb

## 2021-06-09 DIAGNOSIS — I5022 Chronic systolic (congestive) heart failure: Secondary | ICD-10-CM | POA: Diagnosis not present

## 2021-06-09 DIAGNOSIS — I25118 Atherosclerotic heart disease of native coronary artery with other forms of angina pectoris: Secondary | ICD-10-CM

## 2021-06-09 DIAGNOSIS — I255 Ischemic cardiomyopathy: Secondary | ICD-10-CM

## 2021-06-09 DIAGNOSIS — E1169 Type 2 diabetes mellitus with other specified complication: Secondary | ICD-10-CM

## 2021-06-09 DIAGNOSIS — E785 Hyperlipidemia, unspecified: Secondary | ICD-10-CM

## 2021-06-09 DIAGNOSIS — I10 Essential (primary) hypertension: Secondary | ICD-10-CM

## 2021-06-09 DIAGNOSIS — Z79899 Other long term (current) drug therapy: Secondary | ICD-10-CM | POA: Diagnosis not present

## 2021-06-09 MED ORDER — HYDRALAZINE HCL 25 MG PO TABS: 25.0000 mg | ORAL_TABLET | Freq: Two times a day (BID) | ORAL | 3 refills | Status: DC

## 2021-06-09 MED ORDER — RANOLAZINE ER 500 MG PO TB12: 500.0000 mg | ORAL_TABLET | Freq: Two times a day (BID) | ORAL | 3 refills | Status: DC

## 2021-06-09 NOTE — Telephone Encounter (Signed)
Called [name] and left vm to scheduled apt.

## 2021-06-09 NOTE — Patient Instructions (Signed)
Medication Instructions:  Please START Hydralazine 25 mg twice a day Renexa 500 mg twice a day  *If you need a refill on your cardiac medications before your next appointment, please call your pharmacy*  Lab Work: BMET & BNP LABS WILL APPEAR ON MYCHART, ABNORMAL RESULTS WILL BE CALLED  Testing/Procedures: None  Follow-Up: At [facility], you and your health needs are our priority.  As part of our continuing mission to provide you with exceptional heart care, we have created designated Provider Care Teams.  These Care Teams include your primary Cardiologist (physician) and Advanced Practice Providers (APPs -  Physician Assistants and Nurse Practitioners) who all work together to provide you with the care you need, when you need it.   Your next appointment:   1 month(s)  The format for your next appointment:   In Person  Provider:   You may see  ne of the following Advanced Practice Providers on your designated Care Team:    Cadence [place], [place]

## 2021-06-09 NOTE — Progress Notes (Signed)
Cardiology Office Note:    Date:  06/09/2021   ID:  Donald Skinner, DOB Jun 11, 1951, MRN [id number]  PCP:  Lesleigh Noe, MD  [facility] Cardiologist:  None  [facility] Electrophysiologist:  Vickie Epley, MD   Referring MD: Lesleigh Noe, MD   Chief Complaint: 3-4 week f/u  History of Present Illness:    Donald Skinner is a 70 y.o. male with a hx of CAD s/p recent right and left heart catheterization, ischemic cardiomyopathy with LVEF 35 to 40% by echo 07/2016, hypertension, DM2 who presents for 3 week f/u.   Initial PCI to LAD in 2003.  Subsequent PCI to proximal LAD and mid left circumflex 06/2005.  In 02/2010, he underwent PCI to proximal LAD for ISR (he declined single-vessel CABG).  He had PCI to proximal left circumflex 08/2012 in the setting of non-STEMI.  Later that year 12/2012, he underwent PCI to proximal right coronary artery in the setting of STEMI.  07/2016 catheterization in the setting of non-STEMI showed severe multivessel CAD not amenable to PCI or CABG.  Echo at that time showed LVEF 35 to 40%, moderate MR, normal diastolic function, normal PA pressure.     He was seen by his PCP 11/22/2020 and started on HCTZ for hypertension.  He noted a 24-hour history of TIA-like symptoms with paresthesias and heaviness, subsequently self resolving.  He was recommended for neurology referral.   He was seen 11/30/2020 by Laurann Montana, NP and reported his blood pressure was still elevated and greater than 130/80 at home, though he had not yet picked up his HCTZ.  He reported new dyspnea on exertion over the last month.  He reported it as likely due to his TIA episodes.  He was previously walking regularly for exercise.  He also noted chest tightness with exertion, as well as dyspnea.  Stress test was recommended at that time.   Subsequent stress test was abnormal with hypertensive response and recommendation to proceed with cardiac catheterization.  Overall, it was ruled a high risk  study.  EF 30%.   He underwent 01/06/2021 catheterization that showed severe three-vessel CAD predominantly affecting the distal LAD and left circumflex/OM 3, as well as chronic total occlusion of the proximal RCA.  Findings were noted to be similar to outside catheterization report from 2018.  He had patent stents of the mid LAD and mid to distal left circumflex with mild in-stent restenosis.  Normal left and right heart filling pressures were noted.  He had borderline elevated PASP with elevated pulmonary vascular resistance.  Moderately reduced Fick CO/CI noted.  No interventional targets were appreciated with overall appearance similar to that of 2018.     Escalation of GDMT has been complicated by renal function and intolerance/allergies.  Repeat echo on maximum tolerated medical therapy after catheterization 03/01/2021 showed EF continued to be less than 35%, as discussed today, and with recommendation for ICD for primary prevention as below.  He has been unable to tolerate Imdur  due to extreme headache with this medication.  Angioedema has precluded addition of Entresto.    Seen 04/24/2021 and referred to EP with recommendation for ICD implant, scheduled for 06/07/2021.  Last seen 05/19/21 and reported unchanged chest pain. Also appeared volume up and lasix was increased for 2 days. EP referral was given for ICD given EF 30-35%.   Today, the patient reports he has been doing well since the last visit. He is awaiting ICD insertion. Has been coughing a lot  with headache. Also reports some chest pain, similar to prior. He took a COVD test that was negative. This morning feeling better. No LLE, orthopnea, pnd.BP mildly elevated today, has not had medications today. BP at home is generally 140s/80s.    Past Medical History:  Diagnosis Date   Allergy    Diabetes mellitus without complication ([place])    Heart disease    Hyperlipidemia    Hypertension    Myocardial infarction [facility])     Past Surgical  History:  Procedure Laterality Date   CARDIAC CATHETERIZATION     Cardiac stents     x 6 stents- had 5 heart attacks   RIGHT/LEFT HEART CATH AND CORONARY ANGIOGRAPHY N/A 01/06/2021   Procedure: RIGHT/LEFT HEART CATH AND CORONARY ANGIOGRAPHY;  Surgeon: Nelva Bush, MD;  Location: [place] CV LAB;  Service: Cardiovascular;  Laterality: N/A;    Current Medications: Current Meds  Medication Sig   aspirin EC 81 MG tablet Take 81 mg by mouth daily. Swallow whole.   atorvastatin (LIPITOR) 80 MG tablet Take 1 tablet (80 mg total) by mouth daily.   blood glucose meter kit and supplies Dispense based on patient and insurance preference. Use up to four times daily as directed. (FOR ICD-10 E10.9, E11.9).   carvedilol (COREG) 25 MG tablet TAKE 1 TABLET(25 MG) BY MOUTH TWICE DAILY WITH A MEAL   clopidogrel (PLAVIX) 75 MG tablet TAKE 1 TABLET(75 MG) BY MOUTH DAILY   dapagliflozin propanediol (FARXIGA) 5 MG TABS tablet Take 1 tablet (5 mg total) by mouth daily before breakfast.   furosemide (LASIX) 20 MG tablet Take 1 tablet (20 mg) by mouth once daily as directed   hydrALAZINE (APRESOLINE) 25 MG tablet Take 1 tablet (25 mg total) by mouth in the morning and at bedtime.   metFORMIN (GLUCOPHAGE) 500 MG tablet TAKE 1 TABLET(500 MG) BY MOUTH TWICE DAILY WITH A MEAL   nitroGLYCERIN (NITROSTAT) 0.4 MG SL tablet Place 0.4 mg under the tongue every 5 (five) minutes as needed for chest pain.   pioglitazone (ACTOS) 45 MG tablet TAKE 1 TABLET(45 MG) BY MOUTH DAILY   potassium chloride (KLOR-CON) 10 MEQ tablet Take 1 tablet (10 meq) by mouth once daily as directed   ranolazine (RANEXA) 500 MG 12 hr tablet Take 1 tablet (500 mg total) by mouth 2 (two) times daily.     Allergies:   Imdur [isosorbide nitrate], Lipitor [atorvastatin], Losartan potassium, Lisinopril, Rosuvastatin, and Simvastatin   Social History   Socioeconomic History   Marital status: Divorced    Spouse name: Not on file   Number of  children: 4   Years of education: trade school   Highest education level: Not on file  Occupational History   Not on file  Tobacco Use   Smoking status: Never   Smokeless tobacco: Never  Vaping Use   Vaping Use: Never used  Substance and Sexual Activity   Alcohol use: Not Currently   Drug use: Never   Sexual activity: Not Currently  Other Topics Concern   Not on file  Social History Narrative   12/07/19   From: near [place], [place] - moved to be near to daughter   Living: moving out of his daughters - will be living alone   Work: retired due to disability from Engineer, 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      Family: daughter - Roland Rack nearby (nurse), Noni Saupe, has 8 grandchildren      Enjoys: travel, sailing, boating  Exercise: not currently - was walking and bicycling   Diet: has been gaining weight, not doing good with diet - tries to do low carb      Safety   Seat belts: Yes    Guns: No   Safe in relationships: Yes    Social Determinants of Radio broadcast assistant Strain: Not on file  Food Insecurity: Not on file  Transportation Needs: Not on file  Physical Activity: Not on file  Stress: Not on file  Social Connections: Not on file     Family History: The patient's family history includes Alcohol abuse in his brother, brother, father, and mother; Arthritis in his mother; Drug abuse in his brother; Heart attack (age of onset: 11) in his father; Hypertension in his father; Mental illness in his brother.  ROS:   Please see the history of present illness.     All other systems reviewed and are negative.  EKGs/Labs/Other Studies Reviewed:    The following studies were reviewed today:  Echo 03/01/21  1. Left ventricular ejection fraction, by estimation, is 30 to 35%. The  left ventricle has moderate to severely decreased function. The left  ventricle demonstrates regional wall motion abnormalities (see scoring  diagram/findings for description). There   is mild left ventricular hypertrophy. Left ventricular diastolic  parameters are consistent with Grade I diastolic dysfunction (impaired  relaxation). There is severe hypokinesis of the left ventricular, entire  inferolateral wall.   2. Right ventricular systolic function is normal. The right ventricular  size is normal.   3. The mitral valve is grossly normal. Trivial mitral valve  regurgitation.   4. The aortic valve is tricuspid. Aortic valve regurgitation is not  visualized. Mild aortic valve sclerosis is present, with no evidence of  aortic valve stenosis  R/L Cardiac cath 12/2020 Conclusions: Severe three-vessel coronary artery disease predominantly affecting the distal LAD and LCx/OM3 as well as chronic total occlusion of proximal RCA.  Findings appear to be similar to outside catheterization report from 2018. Patent stents in the mid LAD and mid/distal LCx with mild in-stent restenosis. Normal left and right heart filling pressures. Borderline elevated pulmonary artery pressure with elevated pulmonary vascular resistance. Moderately reduced Fick cardiac output/index.   Recommendations: No interventional targets appreciated with overall appearance similar to catheterization report from outside hospital in 2018.  I will add isosorbide mononitrate 30 mg daily for antianginal therapy. Continue goal-directed medical therapy for chronic HFrEF due to ischemic cardiomyopathy.  Medications should be escalated as tolerated. Basic metabolic panel early next week to reassess renal function.  Defer restarting metformin pending BMP. Discussed repeat transthoracic echocardiogram at follow-up visit.  If LVEF is less than 35%, ICD for primary prevention will need to be considered.   Nelva Bush, MD [facility]  EKG:  EKG is not ordered today.   Recent Labs: 11/22/2020: ALT 15 05/05/2021: BNP 195.0 05/17/2021: Hemoglobin 14.3; Platelets 165 05/25/2021: BUN 24; Creatinine, Ser 1.74;  Potassium 4.0; Sodium 138  Recent Lipid Panel    Component Value Date/Time   CHOL 129 09/13/2020 1028   TRIG 62.0 09/13/2020 1028   HDL 53.80 09/13/2020 1028   CHOLHDL 2 09/13/2020 1028   VLDL 12.4 09/13/2020 1028   LDLCALC 63 09/13/2020 1028    Physical Exam:    VS:  BP (!) 152/74   Pulse 83   Ht _0  (1.676 m)   Wt 194 lb (88 kg)   SpO2 95%   BMI 31.31 kg/m  Wt Readings from Last 3 Encounters:  06/09/21 194 lb (88 kg)  05/19/21 192 lb 6 oz (87.3 kg)  05/17/21 190 lb (86.2 kg)     GEN:  Well nourished, well developed in no acute distress HEENT: Normal NECK: No JVD; No carotid bruits LYMPHATICS: No lymphadenopathy CARDIAC: RRR, no murmurs, rubs, gallops RESPIRATORY:  Clear to auscultation without rales, wheezing or rhonchi  ABDOMEN: Soft, non-tender, non-distended MUSCULOSKELETAL:  No edema; No deformity  SKIN: Warm and dry NEUROLOGIC:  Alert and oriented x 3 PSYCHIATRIC:  Normal affect   ASSESSMENT:    1. Medication management   2. Chronic systolic heart failure ([place])   3. Coronary artery disease of native artery of native heart with stable angina pectoris ([place])   4. Hyperlipidemia associated with type 2 diabetes mellitus ([place])   5. Essential hypertension   6. Ischemic cardiomyopathy    PLAN:    In order of problems listed above:  HFrEF/ICM Patient reports he is doing better since the last visit with increase in lasix for a couple days. Appears euvolemic on exam today. Given Echo with LVEF 30-35% he was referred to EP, planning on ICD implantation. Unable to tolerate ACE/ARB/Entresto given renal function and angioedema. Cannot tolerate Imdur due to headaches. Continue Coreg and [place]. Continue current lasix dose at 59m daily. BMET and BNP today.  Severe 3V CAD by cath 12/2020  Reports ongoing chest pain. He did not tolerate Imdur due to headaches. Will try Ranexa 5051mBID. Continue Aspirin, Plavix, statin, BB and SL NTG PRN.   HTN BP elevated.  Continue Coreg. Kidney function precludes use of ACE/ARB/Entresto. Start hydralazine 2549mID.   HLD LDL 63 08/2020. Continue statin.    Disposition: Follow up in 1 month(s) with MD/APP     Signed, Markia Kyer H FNinfa MeekerA-C  06/09/2021 4:20 PM    [facility]

## 2021-06-10 LAB — BASIC METABOLIC PANEL
BUN/Creatinine Ratio: 12 (ref 10–24)
BUN: 18 mg/dL (ref 8–27)
CO2: 23 mmol/L (ref 20–29)
Calcium: 9.3 mg/dL (ref 8.6–10.2)
Chloride: 107 mmol/L — ABNORMAL HIGH (ref 96–106)
Creatinine, Ser: 1.45 mg/dL — ABNORMAL HIGH (ref 0.76–1.27)
Glucose: 133 mg/dL — ABNORMAL HIGH (ref 70–99)
Potassium: 4.4 mmol/L (ref 3.5–5.2)
Sodium: 146 mmol/L — ABNORMAL HIGH (ref 134–144)
eGFR: 51 mL/min/{1.73_m2} — ABNORMAL LOW (ref >59)

## 2021-06-12 NOTE — Telephone Encounter (Signed)
Called patient and left vm.

## 2021-06-15 ENCOUNTER — Other Ambulatory Visit (INDEPENDENT_AMBULATORY_CARE_PROVIDER_SITE_OTHER): Payer: Medicare Other

## 2021-06-15 ENCOUNTER — Telehealth: Payer: Self-pay | Admitting: *Deleted

## 2021-06-15 ENCOUNTER — Telehealth: Payer: Self-pay | Admitting: Cardiology

## 2021-06-15 ENCOUNTER — Other Ambulatory Visit: Payer: Self-pay

## 2021-06-15 DIAGNOSIS — I5022 Chronic systolic (congestive) heart failure: Secondary | ICD-10-CM

## 2021-06-15 NOTE — Telephone Encounter (Signed)
Dr. Lalla Brothers advised.    Per Dr. Molli Knock should follow up with general cardiology to rediscuss cardiac medications.  Will continue to monitor potential need for ICD.

## 2021-06-15 NOTE — Telephone Encounter (Signed)
Spoke with patient to address his questions and concerns. Phone had terrible connection and was not able to hear much of his conversation. Tried to explain these medications are important for his continued health. Was trying to talk about each medication and the first one was his clopidegrel and he stated he is not taking that and he stopped a long time ago. Phone continued to have bad connection then disconnected.   Called him back and spoke briefly then it disconnected again.   Called back and left voicemail message to call back for review of his concerns.

## 2021-06-15 NOTE — Telephone Encounter (Signed)
Patient's daughter called in to see if the patient telephone visit can be moved up so that the procedure can get done. Daughter said that her father is getting worse. Please advise

## 2021-06-15 NOTE — Telephone Encounter (Signed)
-----   Message from Kendrick Fries, [place] sent at 06/15/2021  2:12 PM EST -----  ----- Message ----- From: Bryna Colander, RN Sent: 06/15/2021   2:09 PM EST To: Kendrick Fries, CMA  Who is the patient?   ----- Message ----- From: Kendrick Fries, CMA Sent: 06/15/2021   1:50 PM EST To: [facility] Triage  Pt came in for labs today. Pt voiced his concerns concerning new BP medications. Pt mentioned that he hasn't been taking his prescribed BP meds due to him having concerns of possible contraindications or allergies. Pt would like to discuss meds and see if there are any other options that may be suitable for him He mentioned he did some research on the medications and would like to discuss them.

## 2021-06-15 NOTE — Telephone Encounter (Signed)
Patient came into office due to poor connection with his phone. Printed medication list and he wanted to take hydrochlorothiazide instead of furosemide, reports he had stopped his clopidogrel, and that he stopped some of the other medications as well. Expressed the importance of these medications and that he should really stay on them until he can see provider to discuss. He said that he had looked up his medications on the internet and that he stopped them. Offered him appointment for next week and he refused. I then tried to review each medication and what they were for and how not taking can put him at risk for further health problems. Was trying to have him point out which medications he was concerned about but he just kept saying that the medications were causing him to feel bad and throw up. Inquired if he had seen he had seen his primary care provider for the throwing up and he denied. Again offered appointment for next week but he declined and then stated he would go see his primary care provider and then abruptly walked off.

## 2021-06-15 NOTE — Telephone Encounter (Signed)
Left detailed message per DPR.  Advised that at this time Pt's appointment with DR. Lalla Brothers is cancelled.  Advised Pt had come to office and advised he was not taking any of his cardiac medications.  Taking cardiac medications correctly is central to treatment of heart failure.  Advised we encourage Pt to restart his medications and follow up with general cardiology as scheduled.  If in future an ICD is indicated for Pt will reevaluate at that time.  Advised to call back with further questions.

## 2021-06-16 LAB — BASIC METABOLIC PANEL
BUN/Creatinine Ratio: 15 (ref 10–24)
BUN: 23 mg/dL (ref 8–27)
CO2: 20 mmol/L (ref 20–29)
Calcium: 9.4 mg/dL (ref 8.6–10.2)
Chloride: 103 mmol/L (ref 96–106)
Creatinine, Ser: 1.56 mg/dL — ABNORMAL HIGH (ref 0.76–1.27)
Glucose: 167 mg/dL — ABNORMAL HIGH (ref 70–99)
Potassium: 4.2 mmol/L (ref 3.5–5.2)
Sodium: 141 mmol/L (ref 134–144)
eGFR: 47 mL/min/{1.73_m2} — ABNORMAL LOW (ref >59)

## 2021-06-16 NOTE — Telephone Encounter (Signed)
Spoke with patients daughter per release form. Patients daughter called wanting to schedule his procedure for next Thursday or Friday. Reviewed with her that patient came into office yesterday stating he was not taking some of his medications more specifically his plavix and furosemide. She stated that he is taking his plavix but not the furosemide due to it causing GI issues. She demanded the soonest available appointment for him to have his procedure done. Reviewed that EP canceled his upcoming appointment (Telephone visit) due to him not taking medications and felt he should see primary cardiology to answer his concerns. Advised that when he came in yesterday he was not clear with those concerns and given previous appointments he should have someone accompany him to ensure all are clear on his plan of care. She again expressed frustration and how he needs appointment sooner. Informed her that I offered him appointment for next week but he refused. Reviewed that because of his medication confusion and reports of not taking certain medications this needs to be addressed before any procedure. Given his confusion and because he went for procedure and it was canceled due to him not understanding what and why it was needed provider felt it would be best to hold off on device placement. During our conversation it was hard to understand her because she was at work and there were frequent disruptions with her talking to others. She was demanding appointment, rescheduling of his procedure, and she contradicted what patient verbalized yesterday here in the office when I spoke to him. Expressed again the importance of having someone come with patient to his appointment to assist with his understanding. She reports she will be working but will try to call in for the appointment or she would have patients girlfriend attend. Rescheduled patient to sooner appointment and confirmed with her date, time, and location for that  visit.

## 2021-06-16 NOTE — Telephone Encounter (Signed)
   Pt's daughter returning call, she said the message she got from Boneta Lucks is not accurate. she said the pt's surgery should not bet cancelled because pt needs it badly, pt is very weak and the meds is not working because he is having a lot side effects from meds, she said pt only not taking the diuretics but he is taking all his other meds, she said pt needs a better medications and the procedure.  she requested to get a call back from a nurse as soon as possible, she said she is working in the OR will try her best to answer the phone, if she didn't answer to leave her a message

## 2021-06-16 NOTE — Telephone Encounter (Signed)
It appears that [name] is scheduled to see me later this month (11/28), at which time we can reassess his medication compliance and symptoms.  If he is feeling unwell at this time, we should try to work him with DOD/APP later today or early next week.  Alternatively, he should go to the nearest ED if he has significant shortness of breath or chest pain.  In regard to the ICD placement, it would be most appropriate for [name] to discuss this with Dr. Lalla Brothers once we have clarified his medication compliance.  It should be noted that the ICD is preventative measure to help prevent sudden cardiac death.  The device itself will not make him feel better or improve his heart's pumping function.  I agree that it would be beneficial for one of [name] family members or caregivers to accompany him to his visit so that confusion regarding his care plan can be minimized.  Yvonne Kendall, MD [facility]

## 2021-06-16 NOTE — Telephone Encounter (Signed)
Spoke with patient and reviewed provider ED recommendations if he should have any symptoms of increased shortness of breath or chest pain. Confirmed scheduled appointment here in our office and he had no further questions at this time.

## 2021-06-20 LAB — BRAIN NATRIURETIC PEPTIDE: BNP: 308 pg/mL — ABNORMAL HIGH (ref 0.0–100.0)

## 2021-06-21 ENCOUNTER — Ambulatory Visit: Payer: Medicare Other

## 2021-06-21 ENCOUNTER — Telehealth: Payer: Self-pay

## 2021-06-21 NOTE — Telephone Encounter (Signed)
-----   Message from Cadence David Stall, PA-C sent at 06/21/2021  4:29 PM EST ----- Labs showed still some mild volume overload, would recommend doubling lasix for 5 days to 40mg  daily then back down to 20mg  daily. Re-check BMET and BNP in a week

## 2021-06-21 NOTE — Telephone Encounter (Signed)
Left message on patients voicemail to please return our call.   

## 2021-06-26 ENCOUNTER — Encounter: Payer: Self-pay | Admitting: Internal Medicine

## 2021-06-26 ENCOUNTER — Ambulatory Visit (INDEPENDENT_AMBULATORY_CARE_PROVIDER_SITE_OTHER): Payer: Medicare Other | Admitting: Internal Medicine

## 2021-06-26 ENCOUNTER — Other Ambulatory Visit: Payer: Self-pay

## 2021-06-26 VITALS — BP 130/66 | HR 68 | Ht 66.0 in | Wt 198.0 lb

## 2021-06-26 DIAGNOSIS — I5022 Chronic systolic (congestive) heart failure: Secondary | ICD-10-CM

## 2021-06-26 DIAGNOSIS — E1169 Type 2 diabetes mellitus with other specified complication: Secondary | ICD-10-CM

## 2021-06-26 DIAGNOSIS — I10 Essential (primary) hypertension: Secondary | ICD-10-CM

## 2021-06-26 DIAGNOSIS — I25118 Atherosclerotic heart disease of native coronary artery with other forms of angina pectoris: Secondary | ICD-10-CM | POA: Diagnosis not present

## 2021-06-26 DIAGNOSIS — E785 Hyperlipidemia, unspecified: Secondary | ICD-10-CM

## 2021-06-26 NOTE — Patient Instructions (Signed)
Medication Instructions:   Your physician recommends that you continue on your current medications as directed. Please refer to the Current Medication list given to you today.  You may stop taking Hydralazine and Hydrochlorothiazide (HCTZ)  *If you need a refill on your cardiac medications before your next appointment, please call your pharmacy*   Lab Work:  None ordered  Testing/Procedures:  None ordered   Follow-Up: At [facility], you and your health needs are our priority.  As part of our continuing mission to provide you with exceptional heart care, we have created designated Provider Care Teams.  These Care Teams include your primary Cardiologist (physician) and Advanced Practice Providers (APPs -  Physician Assistants and Nurse Practitioners) who all work together to provide you with the care you need, when you need it.  We recommend signing up for the patient portal called "MyChart".  Sign up information is provided on this After Visit Summary.  MyChart is used to connect with patients for Virtual Visits (Telemedicine).  Patients are able to view lab/test results, encounter notes, upcoming appointments, etc.  Non-urgent messages can be sent to your provider as well.   To learn more about what you can do with MyChart, go to [email or web address].    Your next appointment:    Follow up with Dr. Lalla Brothers for consultation for ICD placement   The format for your next appointment:   In Person

## 2021-06-26 NOTE — Progress Notes (Signed)
Follow-up Outpatient Visit Date: 06/26/2021  Primary Care Provider: Lesleigh Noe, MD [address] [address]  Chief Complaint: Follow-up coronary artery disease and heart failure  HPI:  Donald Skinner is a 70 y.o. 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  He underwent right and left heart catheterization in June, which showed severe three-vessel coronary artery disease, similar to reports from prior outside catheterizations as recently as 2018.  No interventional targets were identified.  Moderately reduced cardiac output noted.  Most recent echo in August showed persistent LVEF of 30-35%, prompting referral to EP for consideration of ICD placement for primary prevention.  He was evaluated by Dr. Quentin Ore with plans for ICD implantation on 06/07/2021.  However, nursing notes from that encounter suggest that patient and daughter had many questions and procedure was canceled.  Subsequent note from Dr. Mardene Speak nurse indicates that follow-up visit with Dr. Quentin Ore to discuss procedure further was canceled due to noncompliance with medications.  Today, Donald Skinner reports that he is feeling better, having stopped hydralazine and HCTZ.  He feels like these medications were making him tired and short of breath.  Since stopping them, his energy has improved.  He still gets out of breath when walking long distances but was able to walk across the parking lot today without needed to rest.  He denies chest pain, palpitations, lightheadedness, and edema.  He is interested in meeting with Dr. Quentin Ore again to discuss risks/benefits of ICD placement in the setting of his severely reduced LVEF due to ischemic  cardiomyopathy.  --------------------------------------------------------------------------------------------------  Cardiovascular History & Procedures: Cardiovascular Problems: Coronary artery disease with prior MI   Risk Factors: Known CAD, hypertension, diabetes mellitus, male gender, obesity, and age > 59   Cath/PCI: PCI to mid LAD in 08/2001 PCI to proximal LAD and mid LCx in 06/2005 PCI to proximal LAD for ISR in 02/2010 (patient declined single-vessel CABG at that time PCI to proximal LCx in setting of NSTEMI in 08/2012 PCI to proximal RCA in the setting of STEMI in 12/2012 LHC in 07/2016 demonstrating normal LMCA, diffuse 30% mid LAD stenosis and diffuse distal LAD disease of 70 to 80%, D1 with proximal diffuse 95% stenosis, ramus intermedius with ostial and proximal 90% stenosis and diffuse disease beyond, nondominant LCx with patent proximal and mid vessel stents.  Severe diffuse 99% stenosis in the distal vessel.  RCA with 100% proximal occlusion with left-to-right collaterals. R/LHC (01/06/2021): LMCA normal.  LAD with mid vessel stent with 20% ISR and chronic total occlusion of apical segment.  D1 with 40% proximal stenosis.  D2 with 70% ostial stenosis.  LCx with patent mid and distal vessel stent with 10% ISR.  OM1 with 40% proximal stenosis.  OM 2 with 50% ostial stenosis.  OM 3 with 95% ostial/proximal stenosis followed by moderate diffuse disease.  Dominant RCA with chronically occluded proximal stent, distal vessel filling via left-to-right collaterals.  RA 6, RV 40/7, PA 40/16 (23), PCWP 10.  Fick CO/CI 3.7/1.9.  PVR 3.5 WU.  LVEDP 11 mmHg.   CV Surgery: None   EP Procedures and Devices: None   Non-Invasive Evaluation(s): TTE (03/01/2021): Normal LV size with mild LVH.  LVEF 30-35% with severe hypokinesis of the inferolateral wall.  Normal RV size and function.  No significant valvular abnormality. Pharmacologic MPI (12/12/1940): Large inferior/inferoseptal/inferolateral  defect consistent with scar and peri-infarct ischemia.  LVEF 30%. TTE (08/28/2016): LVEF 35-40% with mild global hypokinesis and more severe hypokinesis of the inferior, inferoseptal, and mid anterior walls.  Mild mitral annular calcification and moderate mitral regurgitation.  Normal diastolic function.  Normal PA pressure.  Recent CV Pertinent Labs: Lab Results  Component Value Date   CHOL 129 09/13/2020   HDL 53.80 09/13/2020   LDLCALC 63 09/13/2020   TRIG 62.0 09/13/2020   CHOLHDL 2 09/13/2020   BNP 308.0 (H) 06/09/2021   BNP 225.9 (H) 04/24/2021   K 4.2 06/15/2021   BUN 23 06/15/2021   CREATININE 1.56 (H) 06/15/2021    Past medical and surgical history were reviewed and updated in EPIC.  Current Meds  Medication Sig   aspirin EC 81 MG tablet Take 81 mg by mouth daily. Swallow whole.   atorvastatin (LIPITOR) 80 MG tablet Take 1 tablet (80 mg total) by mouth daily.   blood glucose meter kit and supplies Dispense based on patient and insurance preference. Use up to four times daily as directed. (FOR ICD-10 E10.9, E11.9).   carvedilol (COREG) 25 MG tablet TAKE 1 TABLET(25 MG) BY MOUTH TWICE DAILY WITH A MEAL   clopidogrel (PLAVIX) 75 MG tablet TAKE 1 TABLET(75 MG) BY MOUTH DAILY   dapagliflozin propanediol (FARXIGA) 5 MG TABS tablet Take 1 tablet (5 mg total) by mouth daily before breakfast.   furosemide (LASIX) 20 MG tablet Take 1 tablet (20 mg) by mouth once daily as directed   metFORMIN (GLUCOPHAGE) 500 MG tablet TAKE 1 TABLET(500 MG) BY MOUTH TWICE DAILY WITH A MEAL   nitroGLYCERIN (NITROSTAT) 0.4 MG SL tablet Place 0.4 mg under the tongue every 5 (five) minutes as needed for chest pain.   pioglitazone (ACTOS) 45 MG tablet TAKE 1 TABLET(45 MG) BY MOUTH DAILY   potassium chloride (KLOR-CON) 10 MEQ tablet Take 1 tablet (10 meq) by mouth once daily as directed   ranolazine (RANEXA) 500 MG 12 hr tablet Take 1 tablet (500 mg total) by mouth 2 (two) times daily.    Allergies: Imdur  [isosorbide nitrate], Lipitor [atorvastatin], Losartan potassium, Lisinopril, Rosuvastatin, and Simvastatin  Social History   Tobacco Use   Smoking status: Never   Smokeless tobacco: Never  Vaping Use   Vaping Use: Never used  Substance Use Topics   Alcohol use: Not Currently   Drug use: Never    Family History  Problem Relation Age of Onset   Alcohol abuse Mother    Arthritis Mother    Alcohol abuse Father    Heart attack Father 57   Hypertension Father    Alcohol abuse Brother    Drug abuse Brother    Alcohol abuse Brother    Mental illness Brother     Review of Systems: A 12-system review of systems was performed and was negative except as noted in the HPI.  --------------------------------------------------------------------------------------------------  Physical Exam: BP 130/66 (BP Location: Left Arm, Patient Position: Sitting, Cuff Size: Large)   Pulse 68   Ht 5' 6"  (1.676 m)   Wt 198 lb (89.8 kg)   SpO2 98%   BMI 31.96 kg/m   General:  NAD. Neck: No JVD or HJR. Lungs: Clear to auscultation bilaterally without wheezes or crackles. Heart: Regular rate and rhythm without murmurs, rubs, or gallops. Abdomen: Soft, nontender, nondistended. Extremities: No lower extremity edema.  EKG:  Normal sinus rhythm with lateral T wave inversions.  No significant change since 05/19/2021.  Lab Results  Component  Value Date   WBC 4.6 05/17/2021   HGB 14.3 05/17/2021   HCT 43.8 05/17/2021   MCV 86 05/17/2021   PLT 165 05/17/2021    Lab Results  Component Value Date   NA 141 06/15/2021   K 4.2 06/15/2021   CL 103 06/15/2021   CO2 20 06/15/2021   BUN 23 06/15/2021   CREATININE 1.56 (H) 06/15/2021   GLUCOSE 167 (H) 06/15/2021   ALT 15 11/22/2020    Lab Results  Component Value Date   CHOL 129 09/13/2020   HDL 53.80 09/13/2020   LDLCALC 63 09/13/2020   TRIG 62.0 09/13/2020   CHOLHDL 2 09/13/2020     --------------------------------------------------------------------------------------------------  ASSESSMENT AND PLAN: Coronary artery disease with stable angina: No angina reported.  Cath earlier this year showed multivessel CAD with severe disease involving small/distal vessels; there were no targets for PCI/CABG.  Continue current medications for secondary prevention and antianginal therapy, including carvedilol and isosorbide mononitrate.  Chronic HFrEF due to ischemic cardiomyopathy: Donald Skinner appears euvolemic today though his weight has been trending up.  He reports NYHA class II symptoms with significant improvement in fatigue and exertional dyspnea after self-discontinuation of HCTZ and hydralazine.  I think it is reasonable to keep him of these medications but to continue carvedilol and dapagliflozin.  He is not on an ACEI/ARB do to history of cough and tongue swelling concerning for angioedema.  He has also been intolerant of spironolactone in th epast.  As he is receptive to hearing more about ICD placement for primary prevention, we will schedule him to speak with Dr. Quentin Ore again.  Hyperlipidemia associated with type 2 diabetes mellitus: Continue atorvastatin 80 mg daily and ongoing management of DM per Dr. Einar Pheasant.  Hypertension: BP borderline today.  We will defer medication changes at this time, given recent side effects from HCTZ and hydralazine.  Sodium restriction encouraged.  Follow-up: Return to clinic in 3 months.  Nelva Bush, MD 06/26/2021 4:15 PM

## 2021-06-28 ENCOUNTER — Other Ambulatory Visit: Payer: Self-pay

## 2021-06-28 ENCOUNTER — Ambulatory Visit (INDEPENDENT_AMBULATORY_CARE_PROVIDER_SITE_OTHER): Payer: Medicare Other | Admitting: Cardiology

## 2021-06-28 ENCOUNTER — Encounter: Payer: Self-pay | Admitting: Cardiology

## 2021-06-28 ENCOUNTER — Encounter: Payer: Self-pay | Admitting: Internal Medicine

## 2021-06-28 VITALS — BP 148/78 | HR 66 | Ht 66.0 in | Wt 199.0 lb

## 2021-06-28 DIAGNOSIS — I255 Ischemic cardiomyopathy: Secondary | ICD-10-CM

## 2021-06-28 DIAGNOSIS — E1122 Type 2 diabetes mellitus with diabetic chronic kidney disease: Secondary | ICD-10-CM

## 2021-06-28 DIAGNOSIS — N183 Chronic kidney disease, stage 3 unspecified: Secondary | ICD-10-CM

## 2021-06-28 DIAGNOSIS — I5022 Chronic systolic (congestive) heart failure: Secondary | ICD-10-CM

## 2021-06-28 NOTE — Progress Notes (Signed)
Electrophysiology Office Follow up Visit Note:    Date:  06/28/2021   ID:  Donald Skinner, DOB 26-Oct-1950, MRN [id number]  PCP:  Lesleigh Noe, MD   [facility] Electrophysiologist:  Vickie Epley, MD    Interval History:    Donald Skinner is a 70 y.o. male who presents for a follow up visit. They were last seen in clinic May 17, 2021 for chronic systolic heart failure secondary to ischemic cardiomyopathy.  We originally planned to implant an ICD.  This was scheduled for November 9.  When he arrived he was confused about the procedure and did not express an understanding of what we are planning to do so I canceled the procedure.  He has since seen Dr. Saunders Revel in clinic.  He presents to see me in follow-up today to discuss the ICD procedure again.  Today he is able to express an understanding with the defibrillator does.  He tells me that it is for emergencies only.  He knows that it does not change how you feel after it is implanted.  He tells me that he would like to wait until next year to have it implanted.  He would like [place] to check his hemoglobin A1c to see how his blood sugars are.  He would like to wait 6 months to see [place] back.  He "may call sooner" to schedule it earlier depending on what his hemoglobin A1c result is.         Past Medical History:  Diagnosis Date   Allergy    Diabetes mellitus without complication ([place])    Heart disease    Hyperlipidemia    Hypertension    Myocardial infarction [facility])     Past Surgical History:  Procedure Laterality Date   CARDIAC CATHETERIZATION     Cardiac stents     x 6 stents- had 5 heart attacks   RIGHT/LEFT HEART CATH AND CORONARY ANGIOGRAPHY N/A 01/06/2021   Procedure: RIGHT/LEFT HEART CATH AND CORONARY ANGIOGRAPHY;  Surgeon: Nelva Bush, MD;  Location: [facility];  Service: Cardiovascular;  Laterality: N/A;    Current Medications: Current Meds  Medication Sig   aspirin EC 81 MG tablet Take 81 mg by mouth  daily. Swallow whole.   atorvastatin (LIPITOR) 80 MG tablet Take 1 tablet (80 mg total) by mouth daily.   blood glucose meter kit and supplies Dispense based on patient and insurance preference. Use up to four times daily as directed. (FOR ICD-10 E10.9, E11.9).   carvedilol (COREG) 25 MG tablet TAKE 1 TABLET(25 MG) BY MOUTH TWICE DAILY WITH A MEAL   clopidogrel (PLAVIX) 75 MG tablet TAKE 1 TABLET(75 MG) BY MOUTH DAILY   dapagliflozin propanediol (FARXIGA) 5 MG TABS tablet Take 1 tablet (5 mg total) by mouth daily before breakfast.   furosemide (LASIX) 20 MG tablet Take 1 tablet (20 mg) by mouth once daily as directed   metFORMIN (GLUCOPHAGE) 500 MG tablet TAKE 1 TABLET(500 MG) BY MOUTH TWICE DAILY WITH A MEAL   nitroGLYCERIN (NITROSTAT) 0.4 MG SL tablet Place 0.4 mg under the tongue every 5 (five) minutes as needed for chest pain.   pioglitazone (ACTOS) 45 MG tablet TAKE 1 TABLET(45 MG) BY MOUTH DAILY   potassium chloride (KLOR-CON) 10 MEQ tablet Take 1 tablet (10 meq) by mouth once daily as directed   ranolazine (RANEXA) 500 MG 12 hr tablet Take 1 tablet (500 mg total) by mouth 2 (two) times daily.     Allergies:  Imdur [isosorbide nitrate], Lipitor [atorvastatin], Losartan potassium, Lisinopril, Rosuvastatin, and Simvastatin   Social History   Socioeconomic History   Marital status: Divorced    Spouse name: Not on file   Number of children: 4   Years of education: trade school   Highest education level: Not on file  Occupational History   Not on file  Tobacco Use   Smoking status: Never   Smokeless tobacco: Never  Vaping Use   Vaping Use: Never used  Substance and Sexual Activity   Alcohol use: Not Currently   Drug use: Never   Sexual activity: Not Currently  Other Topics Concern   Not on file  Social History Narrative   12/07/19   From: near [place], [place] - moved to be near to daughter   Living: moving out of his daughters - will be living alone   Work: retired due to  disability from Engineer, 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      Family: daughter - Roland Rack nearby (nurse), Noni Saupe, has 8 grandchildren      Enjoys: travel, sailing, boating      Exercise: not currently - was walking and bicycling   Diet: has been gaining weight, not doing good with diet - tries to do low carb      Safety   Seat belts: Yes    Guns: No   Safe in relationships: Yes    Social Determinants of Radio broadcast assistant Strain: Not on file  Food Insecurity: Not on file  Transportation Needs: Not on file  Physical Activity: Not on file  Stress: Not on file  Social Connections: Not on file     Family History: The patient's family history includes Alcohol abuse in his brother, brother, father, and mother; Arthritis in his mother; Drug abuse in his brother; Heart attack (age of onset: 61) in his father; Hypertension in his father; Mental illness in his brother.  ROS:   Please see the history of present illness.    All other systems reviewed and are negative.  EKGs/Labs/Other Studies Reviewed:    The following studies were reviewed today:     Recent Labs: 11/22/2020: ALT 15 05/17/2021: Hemoglobin 14.3; Platelets 165 06/09/2021: BNP 308.0 06/15/2021: BUN 23; Creatinine, Ser 1.56; Potassium 4.2; Sodium 141  Recent Lipid Panel    Component Value Date/Time   CHOL 129 09/13/2020 1028   TRIG 62.0 09/13/2020 1028   HDL 53.80 09/13/2020 1028   CHOLHDL 2 09/13/2020 1028   VLDL 12.4 09/13/2020 1028   LDLCALC 63 09/13/2020 1028    Physical Exam:    VS:  BP (!) 148/78 (BP Location: Left Arm, Patient Position: Sitting, Cuff Size: Normal)   Pulse 66   Ht _0  (1.676 m)   Wt 199 lb (90.3 kg)   SpO2 96%   BMI 32.12 kg/m     Wt Readings from Last 3 Encounters:  06/28/21 199 lb (90.3 kg)  06/26/21 198 lb (89.8 kg)  06/09/21 194 lb (88 kg)     GEN:  Well nourished, well developed in no acute distress HEENT: Normal NECK: No JVD; No carotid  bruits LYMPHATICS: No lymphadenopathy CARDIAC: RRR, no murmurs, rubs, gallops RESPIRATORY:  Clear to auscultation without rales, wheezing or rhonchi  ABDOMEN: Soft, non-tender, non-distended MUSCULOSKELETAL:  No edema; No deformity  SKIN: Warm and dry NEUROLOGIC:  Alert and oriented x 3 PSYCHIATRIC:  Normal affect        ASSESSMENT:    1. Chronic HFrEF (heart  failure with reduced ejection fraction) ([place])   2. Ischemic cardiomyopathy   3. Type 2 diabetes mellitus with stage 3 chronic kidney disease, without long-term current use of insulin, unspecified whether stage 3a or 3b CKD (HCC)    PLAN:    In order of problems listed above:  #Chronic systolic heart failure secondary to ischemic cardiomyopathy NYHA class II-III.  Warm and dry on exam today.  On good medical therapy with Coreg, Farxiga, Lasix. We discussed his risk for sudden cardiac death during today's appointment.  I do think he is a candidate for ICD as I previously told him.  He seems to be very hesitant about the procedure still.  He tells me on multiple occasions that he would like to wait 6 months before scheduling the procedure.  He wants to check a hemoglobin A1c today which I think is reasonable to make sure his blood sugars are under control. We will set a follow-up 6 months from now to revisit the discussion about ICD.  He will let us know if he would like to schedule the ICD implant earlier.  I did try to call his daughter, Roland Rack, at [phone] but went to voicemail.  He tells me that she is at work right now.  #Diabetes He thinks that his blood sugars are not well controlled right now and says that this is the reason he would like to wait 6 months before considering ICD implant.  He wants [place] to check a hemoglobin A1c today.  Follow-up 6 months or sooner if he decides to proceed with ICD implant.    Medication Adjustments/Labs and Tests Ordered: Current medicines are reviewed at length with the patient  today.  Concerns regarding medicines are outlined above.  Orders Placed This Encounter  Procedures   HgB A1c   EKG 12-Lead   No orders of the defined types were placed in this encounter.    Signed, Lars Mage, MD, [facility], [facility] 06/28/2021 4:23 PM    Electrophysiology [facility]

## 2021-06-28 NOTE — Patient Instructions (Addendum)
Medication Instructions:  Your physician recommends that you continue on your current medications as directed. Please refer to the Current Medication list given to you today. *If you need a refill on your cardiac medications before your next appointment, please call your pharmacy*  Lab Work: You will get an A1C. If you have labs (blood work) drawn today and your tests are completely normal, you will receive your results only by: MyChart Message (if you have MyChart) OR A paper copy in the mail If you have any lab test that is abnormal or we need to change your treatment, we will call you to review the results.  Testing/Procedures: None ordered.  Follow-Up: At [facility], you and your health needs are our priority.  As part of our continuing mission to provide you with exceptional heart care, we have created designated Provider Care Teams.  These Care Teams include your primary Cardiologist (physician) and Advanced Practice Providers (APPs -  Physician Assistants and Nurse Practitioners) who all work together to provide you with the care you need, when you need it.  Your next appointment:   Your physician wants you to follow-up in: 6 months with Dr. Lalla Brothers. You will receive a reminder letter in the mail two months in advance. If you don't receive a letter, please call our office to schedule the follow-up appointment.

## 2021-06-29 LAB — HEMOGLOBIN A1C
Est. average glucose Bld gHb Est-mCnc: 177 mg/dL
Hgb A1c MFr Bld: 7.8 % — ABNORMAL HIGH (ref 4.8–5.6)

## 2021-07-04 ENCOUNTER — Telehealth: Payer: Self-pay | Admitting: Nurse Practitioner

## 2021-07-04 NOTE — Telephone Encounter (Signed)
Patient would like lab results.

## 2021-07-04 NOTE — Telephone Encounter (Signed)
Left voicemail message on two numbers for patient to call back.

## 2021-07-04 NOTE — Telephone Encounter (Signed)
Attempted to call patient no ans .  Per notes below patient difficult to reach.  Rescheduled lambert appt for same day with only app slot available near same time.    Will fu and keep trying to contact.  Was patient notified that Lalla Brothers appt was cancelled ? Unknown if patient will be agreeable to change as previously documented by Rinaldo Cloud that he declined an APP appt.

## 2021-07-05 ENCOUNTER — Telehealth: Payer: Medicare Other | Admitting: Cardiology

## 2021-07-06 NOTE — Telephone Encounter (Signed)
Patient was calling to review lab results from 06/28/21 with Dr. Lalla Brothers. Reviewed those results and recommendations with patient and he verbalized understanding with no further questions at this time.

## 2021-07-10 ENCOUNTER — Ambulatory Visit: Payer: Medicare Other | Admitting: Medical

## 2021-07-13 ENCOUNTER — Telehealth: Payer: Self-pay | Admitting: Family Medicine

## 2021-07-14 ENCOUNTER — Other Ambulatory Visit: Payer: Self-pay | Admitting: Family Medicine

## 2021-07-14 MED ORDER — CARVEDILOL 25 MG PO TABS: ORAL_TABLET | ORAL | 0 refills | Status: DC

## 2021-07-14 NOTE — Telephone Encounter (Signed)
Talk to pt  and pt stated he is out of his medication and he need refill . I tried to get him to schedule a lab/cpe

## 2021-07-14 NOTE — Telephone Encounter (Signed)
Pt informed of lab cancellation

## 2021-07-14 NOTE — Addendum Note (Signed)
Addended by: Erby Pian on: 07/14/2021 10:42 AM   Modules accepted: Orders

## 2021-07-14 NOTE — Telephone Encounter (Addendum)
30 day refill given. No further refills until seen. Please call him back to schedule.

## 2021-07-14 NOTE — Telephone Encounter (Signed)
Pt scheduled a lab/cpe in February 2023

## 2021-08-18 ENCOUNTER — Ambulatory Visit: Payer: Medicare Other

## 2021-08-21 ENCOUNTER — Other Ambulatory Visit: Payer: Self-pay | Admitting: Family Medicine

## 2021-09-07 ENCOUNTER — Other Ambulatory Visit: Payer: Medicare Other

## 2021-09-08 ENCOUNTER — Telehealth: Payer: Self-pay | Admitting: Family Medicine

## 2021-09-12 NOTE — Progress Notes (Signed)
Subjective:   Donald Skinner is a 71 y.o. male who presents for Medicare Annual/Subsequent preventive examination.  I connected with Donald Skinner today by telephone and verified that I am speaking with the correct person using two identifiers. Location patient: home Location provider: work Persons participating in the virtual visit: patient, Marine scientist.    I discussed the limitations, risks, security and privacy concerns of performing an evaluation and management service by telephone and the availability of in person appointments. I also discussed with the patient that there may be a patient responsible charge related to this service. The patient expressed understanding and verbally consented to this telephonic visit.    Interactive audio and video telecommunications were attempted between this provider and patient, however failed, due to patient having technical difficulties OR patient did not have access to video capability.  We continued and completed visit with audio only.  Some vital signs may be absent or patient reported.   Time Spent with patient on telephone encounter: 20 minutes  Review of Systems     Cardiac Risk Factors include: advanced age (>28mn, >>75women);diabetes mellitus;hypertension;dyslipidemia     Objective:    Today's Vitals   09/13/21 1443  Weight: 199 lb (90.3 kg)  Height: 5' 5"  (1.651 m)   Body mass index is 33.12 kg/m.  Advanced Directives 09/13/2021 01/06/2021  Does Patient Have a Medical Advance Directive? No No  Would patient like information on creating a medical advance directive? Yes (MAU/Ambulatory/Procedural Areas - Information given) -    Current Medications (verified) Outpatient Encounter Medications as of 09/13/2021  Medication Sig   blood glucose meter kit and supplies Dispense based on patient and insurance preference. Use up to four times daily as directed. (FOR ICD-10 E10.9, E11.9).   carvedilol (COREG) 25 MG tablet TAKE 1 TABLET(25 MG) BY  MOUTH TWICE DAILY WITH A MEAL   metFORMIN (GLUCOPHAGE) 500 MG tablet TAKE 1 TABLET(500 MG) BY MOUTH TWICE DAILY WITH A MEAL   pioglitazone (ACTOS) 45 MG tablet TAKE 1 TABLET(45 MG) BY MOUTH DAILY   aspirin EC 81 MG tablet Take 81 mg by mouth daily. Swallow whole. (Patient not taking: Reported on 09/13/2021)   atorvastatin (LIPITOR) 80 MG tablet Take 1 tablet (80 mg total) by mouth daily. (Patient not taking: Reported on 09/13/2021)   clopidogrel (PLAVIX) 75 MG tablet TAKE 1 TABLET(75 MG) BY MOUTH DAILY (Patient not taking: Reported on 09/13/2021)   dapagliflozin propanediol (FARXIGA) 5 MG TABS tablet Take 1 tablet (5 mg total) by mouth daily before breakfast. (Patient not taking: Reported on 09/13/2021)   furosemide (LASIX) 20 MG tablet Take 1 tablet (20 mg) by mouth once daily as directed (Patient not taking: Reported on 09/13/2021)   nitroGLYCERIN (NITROSTAT) 0.4 MG SL tablet Place 0.4 mg under the tongue every 5 (five) minutes as needed for chest pain. (Patient not taking: Reported on 09/13/2021)   potassium chloride (KLOR-CON) 10 MEQ tablet Take 1 tablet (10 meq) by mouth once daily as directed (Patient not taking: Reported on 09/13/2021)   ranolazine (RANEXA) 500 MG 12 hr tablet Take 1 tablet (500 mg total) by mouth 2 (two) times daily. (Patient not taking: Reported on 09/13/2021)   No facility-administered encounter medications on file as of 09/13/2021.    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   RIGHT/LEFT HEART CATH AND CORONARY ANGIOGRAPHY N/A 01/06/2021   Procedure:  RIGHT/LEFT HEART CATH AND CORONARY ANGIOGRAPHY;  Surgeon: Nelva Bush, MD;  Location: [facility];  Service: Cardiovascular;  Laterality: N/A;   Family History  Problem Relation Age of Onset   Alcohol abuse Mother    Arthritis Mother    Alcohol abuse Father    Heart attack Father 27   Hypertension Father    Alcohol abuse Brother    Drug abuse Brother    Alcohol abuse Brother    Mental illness Brother    Social History   Socioeconomic History   Marital status: Divorced    Spouse name: Not on file   Number of children: 4   Years of education: trade school   Highest education level: Not on file  Occupational History   Not on file  Tobacco Use   Smoking status: Never   Smokeless tobacco: Never  Vaping Use   Vaping Use: Never used  Substance and Sexual Activity   Alcohol use: Not Currently   Drug use: Never   Sexual activity: Not Currently  Other Topics Concern   Not on file  Social History Narrative   12/07/19   From: near [place], [place] - moved to be near to daughter   Living: moving out of his daughters - will be living alone   Work: retired due to disability from Engineer, 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      Family: daughter - Roland Rack nearby (nurse), Noni Saupe, has 8 grandchildren      Enjoys: travel, sailing, boating      Exercise: not currently - was walking and bicycling   Diet: has been gaining weight, not doing good with diet - tries to do low carb      Safety   Seat belts: Yes    Guns: No   Safe in relationships: Yes    Social Determinants of Radio broadcast assistant Strain: Low Risk    Difficulty of Paying Living Expenses: Not hard at all  Food Insecurity: No Food Insecurity   Worried About Charity fundraiser in the Last Year: Never true   [place] in the Last Year: Never true  Transportation Needs: No Transportation Needs   Lack of Transportation (Medical): No   Lack of Transportation (Non-Medical): No  Physical  Activity: Inactive   Days of Exercise per Week: 0 days   Minutes of Exercise per Session: 0 min  Stress: No Stress Concern Present   Feeling of Stress : Not at all  Social Connections: Moderately Isolated   Frequency of Communication with Friends and Family: Never   Frequency of Social Gatherings with Friends and Family: More than three times a week   Attends Religious Services: More than 4 times per year   Active Member of [employer] or Organizations: No   Attends Music therapist: Never   Marital Status: Divorced    Tobacco Counseling Counseling given: Not Answered   Clinical Intake:  Pre-visit preparation completed: Yes  Pain : No/denies pain     BMI - recorded: 33.12 Nutritional Status: BMI > 30  Obese Nutritional Risks: None Diabetes: Yes CBG done?: No Did pt. bring in CBG  monitor from home?: No  How often do you need to have someone help you when you read instructions, pamphlets, or other written materials from your doctor or pharmacy?: 1 - Never  Diabetes:  Is the patient diabetic?  Yes  If diabetic, was a CBG obtained today?  No  Did the patient bring in their glucometer from home?  No  How often do you monitor your CBG's? 1 time per day in the evening.   Financial Strains and Diabetes Management:  Are you having any financial strains with the device, your supplies or your medication? No .  Does the patient want to be seen by Chronic Care Management for management of their diabetes?  No  Would the patient like to be referred to a Nutritionist or for Diabetic Management?  No   Diabetic Exams:  Diabetic Eye Exam: Completed 11/10/20.   Diabetic Foot Exam:  Pt has an appointment schedule with the PCP on 09/18/21.   Interpreter Needed?: No  Information entered by :: Orrin Brigham LPN   Activities of Daily Living In your present state of health, do you have any difficulty performing the following activities: 09/13/2021 01/06/2021  Hearing? N Y   Vision? N N  Difficulty concentrating or making decisions? N N  Walking or climbing stairs? N N  Dressing or bathing? N N  Doing errands, shopping? N -  Preparing Food and eating ? N -  Using the Toilet? N -  In the past six months, have you accidently leaked urine? N -  Do you have problems with loss of bowel control? N -  Managing your Medications? N -  Managing your Finances? N -  Housekeeping or managing your Housekeeping? N -  Some recent data might be hidden    Patient Care Team: Lesleigh Noe, MD as PCP - General (Family Medicine) Vickie Epley, MD as PCP - Electrophysiology (Cardiology) End, Harrell Gave, MD as Consulting Physician (Cardiology)  Indicate any recent Medical Services you may have received from other than [facility] providers in the past year (date may be approximate).     Assessment:   This is a routine wellness examination for Donald Skinner.  Hearing/Vision screen Hearing Screening - Comments:: No issues  Vision Screening - Comments:: Last exam 11/10/20, Dr Milinda Cave   Dietary issues and exercise activities discussed: Current Exercise Habits: The patient does not participate in regular exercise at present   Goals Addressed             This Visit's Progress    Patient Stated       Would like to drink more water and exercise more       Depression Screen PHQ 2/9 Scores 09/13/2021 12/07/2019  PHQ - 2 Score 0 1    Fall Risk Fall Risk  09/13/2021 08/23/2020  Falls in the past year? 0 1  Number falls in past yr: 0 0  Injury with Fall? 0 0  Risk for fall due to : No Fall Risks History of fall(s)  Follow up Falls prevention discussed Falls evaluation completed    [place]:  Any stairs in or around the home? No  If so, are there any without handrails? No  Home free of loose throw rugs in walkways, pet beds, electrical cords, etc? Yes  Adequate lighting in your home to reduce risk of falls? Yes   ASSISTIVE DEVICES  UTILIZED TO PREVENT FALLS:  Life alert? No  Use of a cane, walker or  w/c? No  Grab bars in the bathroom? No  Shower chair or bench in shower? No  Elevated toilet seat or a handicapped toilet? No   TIMED UP AND GO:  Was the test performed? No   Cognitive Function:    Normal cognitive status assessed by  this Nurse Health Advisor. No abnormalities found.      Immunizations Immunization History  Administered Date(s) Administered   PFIZER(Purple Top)SARS-COV-2 Vaccination 09/10/2019, 10/01/2019   Pneumococcal Conjugate-13 03/14/2011   Pneumococcal Polysaccharide-23 03/01/2017   Tdap 03/14/2011    TDAP status: Due, Education has been provided regarding the importance of this vaccine. Advised may receive this vaccine at local pharmacy or Health Dept. Aware to provide a copy of the vaccination record if obtained from local pharmacy or Health Dept. Verbalized acceptance and understanding.  Flu Vaccine status: Up to date  Pneumococcal vaccine status: Up to date  Covid-19 vaccine status: Information provided on how to obtain vaccines.   Qualifies for Shingles Vaccine? Yes   Zostavax completed No   Shingrix Completed?: No.    Education has been provided regarding the importance of this vaccine. Patient has been advised to call insurance company to determine out of pocket expense if they have not yet received this vaccine. Advised may also receive vaccine at local pharmacy or Health Dept. Verbalized acceptance and understanding.  Screening Tests Health Maintenance  Topic Date Due   Hepatitis C Screening  Never done   Zoster Vaccines- Shingrix (1 of 2) Never done   COLONOSCOPY (Pts 45-80yr Insurance coverage will need to be confirmed)  Never done   COVID-19 Vaccine (3 - Pfizer risk series) 10/29/2019   INFLUENZA VACCINE  Never done   TETANUS/TDAP  03/13/2021   FOOT EXAM  03/21/2021   URINE MICROALBUMIN  03/21/2021   OPHTHALMOLOGY EXAM  11/10/2021   HEMOGLOBIN A1C  12/26/2021    Pneumonia Vaccine 71 Years old  Completed   HPV VACCINES  Aged Out    Health Maintenance  Health Maintenance Due  Topic Date Due   Hepatitis C Screening  Never done   Zoster Vaccines- Shingrix (1 of 2) Never done   COLONOSCOPY (Pts 45-45yrInsurance coverage will need to be confirmed)  Never done   COVID-19 Vaccine (3 - Pfizer risk series) 10/29/2019   INFLUENZA VACCINE  Never done   TETANUS/TDAP  03/13/2021   FOOT EXAM  03/21/2021   URINE MICROALBUMIN  03/21/2021    Colorectal Cancer screening: Patient plans on discussing with PCP  Lung Cancer Screening: (Low Dose CT Chest recommended if Age 73-80ears, 30 pack-year currently smoking OR have quit w/in 15years.) does not qualify.     Additional Screening:  Hepatitis C Screening: does qualify  Vision Screening: Recommended annual ophthalmology exams for early detection of glaucoma and other disorders of the eye. Is the patient up to date with their annual eye exam?  Yes  Who is the provider or what is the name of the office in which the patient attends annual eye exams? Dr. WiMilinda Cave Dental Screening: Recommended annual dental exams for proper oral hygiene  Community Resource Referral / Chronic Care Management: CRR required this visit?  No   CCM required this visit?  No      Plan:     I have personally reviewed and noted the following in the patients chart:   Medical and social history Use of alcohol, tobacco or illicit drugs  Current medications and supplements including opioid prescriptions. Patient is not currently  taking opioid prescriptions. Functional ability and status Nutritional status Physical activity Advanced directives List of other physicians Hospitalizations, surgeries, and ER visits in previous 12 months Vitals Screenings to include cognitive, depression, and falls Referrals and appointments  In addition, I have reviewed and discussed with patient certain preventive protocols, quality  metrics, and best practice recommendations. A written personalized care plan for preventive services as well as general preventive health recommendations were provided to patient.   Due to this being a telephonic visit, the after visit summary with patients personalized plan was offered to patient via mail or my-chart.  Patient would like to access on my-chart.   Loma Messing, LPN   [id number]   Nurse Health Advisor  Nurse Notes: Patient plans on providing updated flu vaccine information when available

## 2021-09-13 ENCOUNTER — Ambulatory Visit (INDEPENDENT_AMBULATORY_CARE_PROVIDER_SITE_OTHER): Payer: Medicare Other

## 2021-09-13 ENCOUNTER — Ambulatory Visit: Payer: Medicare Other | Admitting: Nurse Practitioner

## 2021-09-13 ENCOUNTER — Ambulatory Visit: Payer: Medicare Other | Admitting: Cardiology

## 2021-09-13 VITALS — Ht 65.0 in | Wt 199.0 lb

## 2021-09-13 DIAGNOSIS — Z0000 Encounter for general adult medical examination without abnormal findings: Secondary | ICD-10-CM | POA: Diagnosis not present

## 2021-09-13 NOTE — Patient Instructions (Signed)
Donald Skinner , Thank you for taking time to complete your Medicare Wellness Visit. I appreciate your ongoing commitment to your health goals. Please review the following plan we discussed and let me know if I can assist you in the future.   Screening recommendations/referrals: Colonoscopy: due, patient plans on discussing with PCP Recommended yearly ophthalmology/optometry visit for glaucoma screening and checkup Recommended yearly dental visit for hygiene and checkup  Vaccinations: Influenza vaccine: up to date per our conversation, please provide updated vaccine information when available  Pneumococcal vaccine: up to date  Tdap vaccine: due, last completed 03/14/11 Shingles vaccine: Discuss with your local pharmacy   Covid-19: newest booster available at your local pharmacy   Advanced directives: If interested information available @ your next appointment   Conditions/risks identified: see problem list   Next appointment: Follow up in one year for your annual wellness visit. 09/14/22 @ 3:30pm , this will be a telephone visit   Preventive Care 13 Years and Older, Male Preventive care refers to lifestyle choices and visits with your health care provider that can promote health and wellness. What does preventive care include? A yearly physical exam. This is also called an annual well check. Dental exams once or twice a year. Routine eye exams. Ask your health care provider how often you should have your eyes checked. Personal lifestyle choices, including: Daily care of your teeth and gums. Regular physical activity. Eating a healthy diet. Avoiding tobacco and drug use. Limiting alcohol use. Practicing safe sex. Taking low doses of aspirin every day. Taking vitamin and mineral supplements as recommended by your health care provider. What happens during an annual well check? The services and screenings done by your health care provider during your annual well check will depend on your  age, overall health, lifestyle risk factors, and family history of disease. Counseling  Your health care provider may ask you questions about your: Alcohol use. Tobacco use. Drug use. Emotional well-being. Home and relationship well-being. Sexual activity. Eating habits. History of falls. Memory and ability to understand (cognition). Work and work Astronomer. Screening  You may have the following tests or measurements: Height, weight, and BMI. Blood pressure. Lipid and cholesterol levels. These may be checked every 5 years, or more frequently if you are over 78 years old. Skin check. Lung cancer screening. You may have this screening every year starting at age 50 if you have a 30-pack-year history of smoking and currently smoke or have quit within the past 15 years. Fecal occult blood test (FOBT) of the stool. You may have this test every year starting at age 59. Flexible sigmoidoscopy or colonoscopy. You may have a sigmoidoscopy every 5 years or a colonoscopy every 10 years starting at age 48. Prostate cancer screening. Recommendations will vary depending on your family history and other risks. Hepatitis C blood test. Hepatitis B blood test. Sexually transmitted disease (STD) testing. Diabetes screening. This is done by checking your blood sugar (glucose) after you have not eaten for a while (fasting). You may have this done every 1-3 years. Abdominal aortic aneurysm (AAA) screening. You may need this if you are a current or former smoker. Osteoporosis. You may be screened starting at age 77 if you are at high risk. Talk with your health care provider about your test results, treatment options, and if necessary, the need for more tests. Vaccines  Your health care provider may recommend certain vaccines, such as: Influenza vaccine. This is recommended every year. Tetanus, diphtheria, and acellular pertussis (  Tdap, Td) vaccine. You may need a Td booster every 10 years. Zoster  vaccine. You may need this after age 56. Pneumococcal 13-valent conjugate (PCV13) vaccine. One dose is recommended after age 20. Pneumococcal polysaccharide (PPSV23) vaccine. One dose is recommended after age 40. Talk to your health care provider about which screenings and vaccines you need and how often you need them. This information is not intended to replace advice given to you by your health care provider. Make sure you discuss any questions you have with your health care provider. Document Released: 08/12/2015 Document Revised: 04/04/2016 Document Reviewed: 05/17/2015 Elsevier Interactive Patient Education  2017 [place] Prevention in the Home Falls can cause injuries. They can happen to people of all ages. There are many things you can do to make your home safe and to help prevent falls. What can I do on the outside of my home? Regularly fix the edges of walkways and driveways and fix any cracks. Remove anything that might make you trip as you walk through a door, such as a raised step or threshold. Trim any bushes or trees on the path to your home. Use bright outdoor lighting. Clear any walking paths of anything that might make someone trip, such as rocks or tools. Regularly check to see if handrails are loose or broken. Make sure that both sides of any steps have handrails. Any raised decks and porches should have guardrails on the edges. Have any leaves, snow, or ice cleared regularly. Use sand or salt on walking paths during winter. Clean up any spills in your garage right away. This includes oil or grease spills. What can I do in the bathroom? Use night lights. Install grab bars by the toilet and in the tub and shower. Do not use towel bars as grab bars. Use non-skid mats or decals in the tub or shower. If you need to sit down in the shower, use a plastic, non-slip stool. Keep the floor dry. Clean up any water that spills on the floor as soon as it happens. Remove  soap buildup in the tub or shower regularly. Attach bath mats securely with double-sided non-slip rug tape. Do not have throw rugs and other things on the floor that can make you trip. What can I do in the bedroom? Use night lights. Make sure that you have a light by your bed that is easy to reach. Do not use any sheets or blankets that are too big for your bed. They should not hang down onto the floor. Have a firm chair that has side arms. You can use this for support while you get dressed. Do not have throw rugs and other things on the floor that can make you trip. What can I do in the kitchen? Clean up any spills right away. Avoid walking on wet floors. Keep items that you use a lot in easy-to-reach places. If you need to reach something above you, use a strong step stool that has a grab bar. Keep electrical cords out of the way. Do not use floor polish or wax that makes floors slippery. If you must use wax, use non-skid floor wax. Do not have throw rugs and other things on the floor that can make you trip. What can I do with my stairs? Do not leave any items on the stairs. Make sure that there are handrails on both sides of the stairs and use them. Fix handrails that are broken or loose. Make sure that handrails are  as long as the stairways. Check any carpeting to make sure that it is firmly attached to the stairs. Fix any carpet that is loose or worn. Avoid having throw rugs at the top or bottom of the stairs. If you do have throw rugs, attach them to the floor with carpet tape. Make sure that you have a light switch at the top of the stairs and the bottom of the stairs. If you do not have them, ask someone to add them for you. What else can I do to help prevent falls? Wear shoes that: Do not have high heels. Have rubber bottoms. Are comfortable and fit you well. Are closed at the toe. Do not wear sandals. If you use a stepladder: Make sure that it is fully opened. Do not climb a  closed stepladder. Make sure that both sides of the stepladder are locked into place. Ask someone to hold it for you, if possible. Clearly mark and make sure that you can see: Any grab bars or handrails. First and last steps. Where the edge of each step is. Use tools that help you move around (mobility aids) if they are needed. These include: Canes. Walkers. Scooters. Crutches. Turn on the lights when you go into a dark area. Replace any light bulbs as soon as they burn out. Set up your furniture so you have a clear path. Avoid moving your furniture around. If any of your floors are uneven, fix them. If there are any pets around you, be aware of where they are. Review your medicines with your doctor. Some medicines can make you feel dizzy. This can increase your chance of falling. Ask your doctor what other things that you can do to help prevent falls. This information is not intended to replace advice given to you by your health care provider. Make sure you discuss any questions you have with your health care provider. Document Released: 05/12/2009 Document Revised: 12/22/2015 Document Reviewed: 08/20/2014 Elsevier Interactive Patient Education  2017 [employer].

## 2021-09-14 ENCOUNTER — Encounter: Payer: Medicare Other | Admitting: Family Medicine

## 2021-09-18 ENCOUNTER — Other Ambulatory Visit: Payer: Self-pay

## 2021-09-18 ENCOUNTER — Ambulatory Visit (INDEPENDENT_AMBULATORY_CARE_PROVIDER_SITE_OTHER): Payer: Medicare Other | Admitting: Family Medicine

## 2021-09-18 VITALS — BP 130/62 | HR 68 | Temp 97.9°F | Ht 65.75 in | Wt 195.2 lb

## 2021-09-18 DIAGNOSIS — E1122 Type 2 diabetes mellitus with diabetic chronic kidney disease: Secondary | ICD-10-CM

## 2021-09-18 DIAGNOSIS — Z1211 Encounter for screening for malignant neoplasm of colon: Secondary | ICD-10-CM | POA: Diagnosis not present

## 2021-09-18 DIAGNOSIS — B351 Tinea unguium: Secondary | ICD-10-CM

## 2021-09-18 DIAGNOSIS — N183 Chronic kidney disease, stage 3 unspecified: Secondary | ICD-10-CM

## 2021-09-18 DIAGNOSIS — Z0000 Encounter for general adult medical examination without abnormal findings: Secondary | ICD-10-CM

## 2021-09-18 DIAGNOSIS — I10 Essential (primary) hypertension: Secondary | ICD-10-CM | POA: Diagnosis not present

## 2021-09-18 DIAGNOSIS — E785 Hyperlipidemia, unspecified: Secondary | ICD-10-CM

## 2021-09-18 DIAGNOSIS — Z1159 Encounter for screening for other viral diseases: Secondary | ICD-10-CM

## 2021-09-18 DIAGNOSIS — E1169 Type 2 diabetes mellitus with other specified complication: Secondary | ICD-10-CM | POA: Diagnosis not present

## 2021-09-18 MED ORDER — METFORMIN HCL 500 MG PO TABS: 1000.0000 mg | ORAL_TABLET | Freq: Two times a day (BID) | ORAL | 3 refills | Status: AC

## 2021-09-18 NOTE — Progress Notes (Signed)
Annual Exam   Chief Complaint:  Chief Complaint  Patient presents with   Medicare Wellness    History of Present Illness:  Donald Skinner is a 71 y.o. presents today for annual examination.     Nutrition/Lifestyle Diet: skipping meals occasionally, tries to follow diabetic diet Exercise: walking at work He is not sexually active.    Social History   Tobacco Use  Smoking Status Never  Smokeless Tobacco Never   Social History   Substance and Sexual Activity  Alcohol Use Not Currently   Social History   Substance and Sexual Activity  Drug Use Never     Safety The patient wears seatbelts: yes.     The patient feels safe at home and in their relationships: yes.  General Health Dentist in the last year: Yes Eye doctor: yes  Weight Wt Readings from Last 3 Encounters:  09/18/21 195 lb 3 oz (88.5 kg)  09/13/21 199 lb (90.3 kg)  06/28/21 199 lb (90.3 kg)   Patient has high BMI  BMI Readings from Last 1 Encounters:  09/18/21 31.74 kg/m     Chronic disease screening Blood pressure monitoring:  BP Readings from Last 3 Encounters:  09/18/21 130/62  06/28/21 (!) 148/78  06/26/21 130/66    Lipid Monitoring: Indication for screening: age >35, obesity, diabetes, family hx, CV risk factors.  Lipid screening: Yes  Lab Results  Component Value Date   CHOL 129 09/13/2020   HDL 53.80 09/13/2020   LDLCALC 63 09/13/2020   TRIG 62.0 09/13/2020   CHOLHDL 2 09/13/2020     Diabetes Screening: age >60, overweight, family hx, PCOS, hx of gestational diabetes, at risk ethnicity, elevated blood pressure >135/80.  Diabetes Screening screening: Yes  Lab Results  Component Value Date   HGBA1C 7.8 (H) 06/28/2021     Prostate Cancer Screening: Yes Age 44-69 yo Shared Decision Making Higher Risk: Older age, African American, Family Hx of Prostate Cancer - Yes Benefits: screening may prevent 1.3 deaths from prostate cancer over 13 years per 1000 men screened and prevent  3 metastatic cases per 1000 men screened. Not enough evidence to support more benefit for AA or [place]: False Positive and psychological harms. 15% of me with false positive over a 2 to 4 year period > resulting in biopsy and complications such as pain, hematospermia, infections. Overdiagnosis - increases with age - found that 20-50% of prostate cancer through screening may have never caused any issues. Harms of treatment include - erectile dysfunction, urinary incontinence, and bothersome bowel symptoms.   After discussion he does not want to get a PSA checked today.   Inadequate evidence for screening <55 No mortality benefit for screening >70   Lab Results  Component Value Date   PSA 0.3 09/29/2019       Colon Cancer Screening:  Age 47-75 yo - benefits outweigh the risk. Adults 66-85 yo who have never been screened benefit.  Benefits: 134000 people in 2016 will be diagnosed and 49,000 will die - early detection helps Harms: Complications 2/2 to colonoscopy High Risk (Colonoscopy): genetic disorder (Lynch syndrome or familial adenomatous polyposis), personal hx of IBD, previous adenomatous polyp, or previous colorectal cancer, FamHx start 10 years before the age at diagnosis, increased in males and black race  Options:  FIT - looks for hemoglobin (blood in the stool) - specific and fairly sensitive - must be done annually Cologuard - looks for DNA and blood - more sensitive - therefore can have more false positives,  every 3 years Colonoscopy - every 10 years if normal - sedation, bowl prep, must have someone drive you  Shared decision making and the patient had decided to do colonoscopy.   Social History   Tobacco Use  Smoking Status Never  Smokeless Tobacco Never    Lung Cancer Screening (Ages 74-25): not applicable     Past Medical History:  Diagnosis Date   Allergy    Diabetes mellitus without complication ([place])    Heart disease    Hyperlipidemia     Hypertension    Myocardial infarction [facility])     Past Surgical History:  Procedure Laterality Date   CARDIAC CATHETERIZATION     Cardiac stents     x 6 stents- had 5 heart attacks   RIGHT/LEFT HEART CATH AND CORONARY ANGIOGRAPHY N/A 01/06/2021   Procedure: RIGHT/LEFT HEART CATH AND CORONARY ANGIOGRAPHY;  Surgeon: Nelva Bush, MD;  Location: [facility];  Service: Cardiovascular;  Laterality: N/A;    Prior to Admission medications   Medication Sig Start Date End Date Taking? Authorizing Provider  atorvastatin (LIPITOR) 80 MG tablet Take 1 tablet (80 mg total) by mouth daily. 12/22/20  Yes Lesleigh Noe, MD  blood glucose meter kit and supplies Dispense based on patient and insurance preference. Use up to four times daily as directed. (FOR ICD-10 E10.9, E11.9). 03/04/20  Yes Lesleigh Noe, MD  carvedilol (COREG) 25 MG tablet TAKE 1 TABLET(25 MG) BY MOUTH TWICE DAILY WITH A MEAL 08/21/21  Yes Lesleigh Noe, MD  clopidogrel (PLAVIX) 75 MG tablet TAKE 1 TABLET(75 MG) BY MOUTH DAILY 12/22/20  Yes Lesleigh Noe, MD  furosemide (LASIX) 20 MG tablet Take 1 tablet (20 mg) by mouth once daily as directed Patient taking differently: Take 20 mg by mouth as needed. Take 1 tablet (20 mg) by mouth once daily as directed 04/28/21  Yes Visser, Jacquelyn D, PA-C  metFORMIN (GLUCOPHAGE) 500 MG tablet TAKE 1 TABLET(500 MG) BY MOUTH TWICE DAILY WITH A MEAL 03/30/21  Yes Lesleigh Noe, MD  nitroGLYCERIN (NITROSTAT) 0.4 MG SL tablet Place 0.4 mg under the tongue every 5 (five) minutes as needed for chest pain.   Yes [provider]  pioglitazone (ACTOS) 45 MG tablet TAKE 1 TABLET(45 MG) BY MOUTH DAILY 12/22/20  Yes Lesleigh Noe, MD  potassium chloride (KLOR-CON) 10 MEQ tablet Take 1 tablet (10 meq) by mouth once daily as directed Patient taking differently: Take 10 mEq by mouth as needed. Take 1 tablet (10 meq) by mouth once daily as directed 04/28/21  Yes Marrianne Mood D, PA-C   aspirin EC 81 MG tablet Take 81 mg by mouth daily. Swallow whole. Patient not taking: Reported on 09/13/2021    [provider]  dapagliflozin propanediol (FARXIGA) 5 MG TABS tablet Take 1 tablet (5 mg total) by mouth daily before breakfast. Patient not taking: Reported on 09/13/2021 01/13/21   Marrianne Mood D, PA-C  ranolazine (RANEXA) 500 MG 12 hr tablet Take 1 tablet (500 mg total) by mouth 2 (two) times daily. Patient not taking: Reported on 09/13/2021 06/09/21   Furth, Cadence H, PA-C    Allergies  Allergen Reactions   Imdur [Isosorbide Nitrate]     headache   Lipitor [Atorvastatin]     myalgia   Losartan Potassium Swelling    Tongue swelling   Lisinopril Cough   Rosuvastatin Other (See Comments)    myalgias   Simvastatin Other (See Comments)    weakness  Social History   Socioeconomic History   Marital status: Divorced    Spouse name: Not on file   Number of children: 4   Years of education: trade school   Highest education level: Not on file  Occupational History   Not on file  Tobacco Use   Smoking status: Never   Smokeless tobacco: Never  Vaping Use   Vaping Use: Never used  Substance and Sexual Activity   Alcohol use: Not Currently   Drug use: Never   Sexual activity: Not Currently  Other Topics Concern   Not on file  Social History Narrative   12/07/19   From: near [place], [place] - moved to be near to daughter   Living: moving out of his daughters - will be living alone   Work: retired due to disability from Engineer, 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      Family: daughter - Roland Rack nearby (nurse), Noni Saupe, has 8 grandchildren      Enjoys: travel, sailing, boating      Exercise: not currently - was walking and bicycling   Diet: has been gaining weight, not doing good with diet - tries to do low carb      Safety   Seat belts: Yes    Guns: No   Safe in relationships: Yes    Social Determinants of Radio broadcast assistant  Strain: Low Risk    Difficulty of Paying Living Expenses: Not hard at all  Food Insecurity: No Food Insecurity   Worried About Charity fundraiser in the Last Year: Never true   [place] in the Last Year: Never true  Transportation Needs: No Transportation Needs   Lack of Transportation (Medical): No   Lack of Transportation (Non-Medical): No  Physical Activity: Inactive   Days of Exercise per Week: 0 days   Minutes of Exercise per Session: 0 min  Stress: No Stress Concern Present   Feeling of Stress : Not at all  Social Connections: Moderately Isolated   Frequency of Communication with Friends and Family: Never   Frequency of Social Gatherings with Friends and Family: More than three times a week   Attends Religious Services: More than 4 times per year   Active Member of [employer] or Organizations: No   Attends Music therapist: Never   Marital Status: Divorced  Human resources officer Violence: Not At Risk   Fear of Current or Ex-Partner: No   Emotionally Abused: No   Physically Abused: No   Sexually Abused: No    Family History  Problem Relation Age of Onset   Alcohol abuse Mother    Arthritis Mother    Alcohol abuse Father    Heart attack Father 90   Hypertension Father    Alcohol abuse Brother    Drug abuse Brother    Alcohol abuse Brother    Mental illness Brother     Review of Systems  Constitutional:  Negative for chills and fever.  HENT:  Negative for congestion and sore throat.   Eyes:  Negative for blurred vision and double vision.  Respiratory:  Negative for shortness of breath.   Cardiovascular:  Negative for chest pain.  Gastrointestinal:  Negative for heartburn, nausea and vomiting.  Genitourinary: Negative.   Musculoskeletal: Negative.  Negative for myalgias.  Skin:  Negative for rash.  Neurological:  Negative for dizziness and headaches.  Endo/Heme/Allergies:  Does not bruise/bleed easily.  Psychiatric/Behavioral:  Negative for depression.  The patient is  not nervous/anxious.     Physical Exam BP 130/62    Pulse 68    Temp 97.9 F (36.6 C) (Oral)    Ht 5' 5.75" (1.67 m)    Wt 195 lb 3 oz (88.5 kg)    SpO2 99%    BMI 31.74 kg/m    BP Readings from Last 3 Encounters:  09/18/21 130/62  06/28/21 (!) 148/78  06/26/21 130/66      Physical Exam Constitutional:      General: He is not in acute distress.    Appearance: He is well-developed. He is not diaphoretic.  HENT:     Head: Normocephalic and atraumatic.     Right Ear: Tympanic membrane and ear canal normal.     Left Ear: Tympanic membrane and ear canal normal.     Nose: Nose normal.     Mouth/Throat:     Pharynx: Uvula midline.  Eyes:     General: No scleral icterus.    Conjunctiva/sclera: Conjunctivae normal.     Pupils: Pupils are equal, round, and reactive to light.  Cardiovascular:     Rate and Rhythm: Normal rate and regular rhythm.     Heart sounds: Normal heart sounds. No murmur heard. Pulmonary:     Effort: Pulmonary effort is normal. No respiratory distress.     Breath sounds: Normal breath sounds. No wheezing.  Abdominal:     General: Bowel sounds are normal. There is no distension.     Palpations: Abdomen is soft. There is no mass.     Tenderness: There is no abdominal tenderness. There is no guarding.  Musculoskeletal:        General: Normal range of motion.     Cervical back: Normal range of motion and neck supple.  Lymphadenopathy:     Cervical: No cervical adenopathy.  Skin:    General: Skin is warm and dry.     Capillary Refill: Capillary refill takes less than 2 seconds.  Neurological:     Mental Status: He is alert and oriented to person, place, and time.       Results:  PHQ-9:  Depression screen [facility] 2/9 09/13/2021 12/07/2019  Decreased Interest 0 0  Down, Depressed, Hopeless 0 1  PHQ - 2 Score 0 1       Assessment: 71 y.o. here for routine annual physical examination.  Plan: Problem List Items Addressed This Visit        Cardiovascular and Mediastinum   Essential hypertension   Relevant Orders   CBC     Endocrine   Type 2 diabetes mellitus with diabetic chronic kidney disease ([place])   Relevant Medications   metFORMIN (GLUCOPHAGE) 500 MG tablet   Other Relevant Orders   CBC   Microalbumin / creatinine urine ratio   Ambulatory referral to Podiatry   Hyperlipidemia associated with type 2 diabetes mellitus ([place])   Relevant Medications   metFORMIN (GLUCOPHAGE) 500 MG tablet   Other Relevant Orders   Comprehensive metabolic panel   Lipid panel     Other   Screening for colon cancer   Relevant Orders   Ambulatory referral to Gastroenterology   Other Visit Diagnoses     Annual physical exam    -  Primary   Need for hepatitis C screening test       Relevant Orders   Hepatitis C antibody   Onychomycosis of multiple toenails with type 2 diabetes mellitus ([place])       Relevant Medications   metFORMIN (  GLUCOPHAGE) 500 MG tablet   Other Relevant Orders   Ambulatory referral to Podiatry       Screening: -- Blood pressure screen normal -- cholesterol screening: will obtain -- Weight screening: obese: discussed management options, including lifestyle, dietary, and exercise. -- Diabetes Screening: will obtain -- Nutrition: normal - Encouraged healthy diet  The ASCVD Risk score (Arnett DK, et al., 2019) failed to calculate for the following reasons:   The patient has a prior MI or stroke diagnosis  -- ASA 81 mg discussed if CVD risk >10% age 49-59 and willing to take for 10 years -- Statin therapy for Age 64-75 with CVD risk >7.5%  Psych -- Depression screening (PHQ-9): negative  Safety -- tobacco screening: not using -- alcohol screening: \ low-risk usage. -- no evidence of domestic violence or intimate partner violence.  Cancer Screening -- Prostate (age 8-69)  not indicated -- Colon (age 75-75)  referral -- Lung not indicated   Immunizations Immunization History  Administered  Date(s) Administered   PFIZER(Purple Top)SARS-COV-2 Vaccination 09/10/2019, 10/01/2019   Pneumococcal Conjugate-13 03/14/2011   Pneumococcal Polysaccharide-23 03/01/2017   Tdap 03/14/2011    -- flu vaccine will request -- TDAP q10 years not up to date - encouraged getting -- Shingles (age >44) not up to date - encouraged getting -- PPSV-23 (19-64 with chronic disease or smoking) up to date -- PCV-13 (age >40) - one dose followed by PPSV-23 1 year later up to date -- Covid-19 Vaccine up to date  Encouraged regular vision and dental screening. Encouraged healthy exercise and diet.   Lesleigh Noe

## 2021-09-18 NOTE — Assessment & Plan Note (Signed)
Too early for hemoglobin A1c though not at goal on last check.  Discussed would like to get off Actos because of CHF.  Patient notes side effects to [place].  We will increase metformin to 1000 mg twice a day from 500 mg twice a day.  Return in 1 month for hemoglobin A1c check.

## 2021-09-18 NOTE — Patient Instructions (Addendum)
[facility] - [phone]    Can look into getting Shingles Shot and Tetanus shot at the pharmacy  Diabetes - Increase Metformin Week 1: take 1000 mg (2 pills) with breakfast, 500 mg with dinner Week 2: take 1000 mg twice daily with meals  #Referral to GI for colon cancer screening I have placed a referral to a specialist for you. You should receive a phone call from the specialty office. Make sure your voicemail is not full and that if you are able to answer your phone to unknown or new numbers.   It may take up to 2 weeks to hear about the referral. If you do not hear anything in 2 weeks, please call our office and ask to speak with the referral coordinator.

## 2021-09-19 ENCOUNTER — Other Ambulatory Visit: Payer: Self-pay | Admitting: Family Medicine

## 2021-09-19 DIAGNOSIS — N1832 Chronic kidney disease, stage 3b: Secondary | ICD-10-CM

## 2021-09-19 LAB — COMPREHENSIVE METABOLIC PANEL
ALT: 16 U/L (ref 0–53)
AST: 16 U/L (ref 0–37)
Albumin: 4.4 g/dL (ref 3.5–5.2)
Alkaline Phosphatase: 60 U/L (ref 39–117)
BUN: 25 mg/dL — ABNORMAL HIGH (ref 6–23)
CO2: 33 mEq/L — ABNORMAL HIGH (ref 19–32)
Calcium: 9.9 mg/dL (ref 8.4–10.5)
Chloride: 102 mEq/L (ref 96–112)
Creatinine, Ser: 1.81 mg/dL — ABNORMAL HIGH (ref 0.40–1.50)
GFR: 36.76 mL/min — ABNORMAL LOW (ref >60.00)
Glucose, Bld: 166 mg/dL — ABNORMAL HIGH (ref 70–99)
Potassium: 4.4 mEq/L (ref 3.5–5.1)
Sodium: 140 mEq/L (ref 135–145)
Total Bilirubin: 0.8 mg/dL (ref 0.2–1.2)
Total Protein: 7.2 g/dL (ref 6.0–8.3)

## 2021-09-19 LAB — CBC
HCT: 39.3 % (ref 39.0–52.0)
Hemoglobin: 12.7 g/dL — ABNORMAL LOW (ref 13.0–17.0)
MCHC: 32.3 g/dL (ref 30.0–36.0)
MCV: 87.6 fl (ref 78.0–100.0)
Platelets: 137 10*3/uL — ABNORMAL LOW (ref 150.0–400.0)
RBC: 4.49 Mil/uL (ref 4.22–5.81)
RDW: 18.4 % — ABNORMAL HIGH (ref 11.5–15.5)
WBC: 4.7 10*3/uL (ref 4.0–10.5)

## 2021-09-19 LAB — MICROALBUMIN / CREATININE URINE RATIO
Creatinine,U: 150 mg/dL
Microalb Creat Ratio: 26.3 mg/g (ref 0.0–30.0)
Microalb, Ur: 39.5 mg/dL — ABNORMAL HIGH (ref 0.0–1.9)

## 2021-09-19 LAB — HEPATITIS C ANTIBODY
Hepatitis C Ab: NONREACTIVE
SIGNAL TO CUT-OFF: <0.02 (ref <1.00)

## 2021-09-19 LAB — LIPID PANEL
Cholesterol: 137 mg/dL (ref 0–200)
HDL: 47.4 mg/dL (ref >39.00)
LDL Cholesterol: 64 mg/dL (ref 0–99)
NonHDL: 90
Total CHOL/HDL Ratio: 3
Triglycerides: 130 mg/dL (ref 0.0–149.0)
VLDL: 26 mg/dL (ref 0.0–40.0)

## 2021-09-21 ENCOUNTER — Telehealth: Payer: Self-pay

## 2021-09-21 NOTE — Telephone Encounter (Signed)
CALLED PATIENT NO ANSWER LEFT VOICEMAIL FOR A CALL BACK °Letter sent °

## 2021-09-25 DIAGNOSIS — I25118 Atherosclerotic heart disease of native coronary artery with other forms of angina pectoris: Secondary | ICD-10-CM | POA: Diagnosis not present

## 2021-09-25 DIAGNOSIS — E1169 Type 2 diabetes mellitus with other specified complication: Secondary | ICD-10-CM | POA: Diagnosis not present

## 2021-10-02 ENCOUNTER — Other Ambulatory Visit: Payer: Self-pay | Admitting: Family Medicine

## 2021-10-16 ENCOUNTER — Encounter: Payer: Self-pay | Admitting: Family Medicine

## 2021-10-16 ENCOUNTER — Other Ambulatory Visit: Payer: Self-pay

## 2021-10-16 ENCOUNTER — Ambulatory Visit (INDEPENDENT_AMBULATORY_CARE_PROVIDER_SITE_OTHER): Payer: Medicare Other | Admitting: Family Medicine

## 2021-10-16 VITALS — BP 128/64 | HR 66 | Ht 65.75 in | Wt 190.6 lb

## 2021-10-16 DIAGNOSIS — N1832 Chronic kidney disease, stage 3b: Secondary | ICD-10-CM | POA: Insufficient documentation

## 2021-10-16 DIAGNOSIS — E1122 Type 2 diabetes mellitus with diabetic chronic kidney disease: Secondary | ICD-10-CM | POA: Diagnosis not present

## 2021-10-16 DIAGNOSIS — E785 Hyperlipidemia, unspecified: Secondary | ICD-10-CM | POA: Diagnosis not present

## 2021-10-16 DIAGNOSIS — E1169 Type 2 diabetes mellitus with other specified complication: Secondary | ICD-10-CM | POA: Diagnosis not present

## 2021-10-16 DIAGNOSIS — N183 Chronic kidney disease, stage 3 unspecified: Secondary | ICD-10-CM | POA: Diagnosis not present

## 2021-10-16 LAB — POCT GLYCOSYLATED HEMOGLOBIN (HGB A1C): Hemoglobin A1C: 6.7 % — AB (ref 4.0–5.6)

## 2021-10-16 MED ORDER — BLOOD GLUCOSE MONITOR KIT: PACK | 0 refills | Status: AC

## 2021-10-16 MED ORDER — EMPAGLIFLOZIN 10 MG PO TABS: 10.0000 mg | ORAL_TABLET | Freq: Every day | ORAL | 1 refills | Status: AC

## 2021-10-16 NOTE — Progress Notes (Signed)
? ?Subjective:  ? ?  ?Donald Skinner is a 71 y.o. male presenting for Follow-up ?  ? ? ?HPI ? ?#Diabetes ?Currently taking metformin (Glucophage, [place]) - ran out of stopped actos 3 days ago ?Using medications without difficulties: Yes ?Hypoglycemic episodes: does not check ?Hyperglycemic episodes: does not check ?Feet problems:No  ?Blood Sugars averaging: does not check ?Last HgbA1c:  ?Lab Results  ?Component Value Date  ? HGBA1C 6.7 (A) 10/16/2021  ? ? ?Diabetes Health Maintenance Due:  ?  ?Diabetes Health Maintenance Due  ?Topic Date Due  ? OPHTHALMOLOGY EXAM  11/10/2021  ? HEMOGLOBIN A1C  04/18/2022  ? FOOT EXAM  09/18/2022  ? URINE MICROALBUMIN  09/18/2022  ? ?Endorses eating chocolate ? ? ? ?Review of Systems ? ? ?Social History  ? ?Tobacco Use  ?Smoking Status Never  ?Smokeless Tobacco Never  ? ? ? ?   ?Objective:  ?  ?BP Readings from Last 3 Encounters:  ?10/16/21 128/64  ?09/18/21 130/62  ?06/28/21 (!) 148/78  ? ?Wt Readings from Last 3 Encounters:  ?10/16/21 190 lb 9.6 oz (86.5 kg)  ?09/18/21 195 lb 3 oz (88.5 kg)  ?09/13/21 199 lb (90.3 kg)  ? ? ?BP 128/64 (BP Location: Left Arm, Patient Position: Sitting, Cuff Size: Normal)   Pulse 66   Ht 5' 5.75" (1.67 m)   Wt 190 lb 9.6 oz (86.5 kg)   SpO2 97%   BMI 31.00 kg/m?  ? ? ?Physical Exam ?Constitutional:   ?   Appearance: Normal appearance. He is not ill-appearing or diaphoretic.  ?HENT:  ?   Right Ear: External ear normal.  ?   Left Ear: External ear normal.  ?Eyes:  ?   General: No scleral icterus. ?   Extraocular Movements: Extraocular movements intact.  ?   Conjunctiva/sclera: Conjunctivae normal.  ?Cardiovascular:  ?   Rate and Rhythm: Normal rate and regular rhythm.  ?Pulmonary:  ?   Effort: Pulmonary effort is normal. No respiratory distress.  ?   Breath sounds: Normal breath sounds. No wheezing.  ?Musculoskeletal:  ?   Cervical back: Neck supple.  ?Skin: ?   General: Skin is warm and dry.  ?Neurological:  ?   Mental Status: He is alert. Mental  status is at baseline.  ?Psychiatric:     ?   Mood and Affect: Mood normal.  ? ? ? ? ? ?   ?Assessment & Plan:  ? ?Problem List Items Addressed This Visit   ? ?  ? Endocrine  ? Type 2 diabetes mellitus with diabetic chronic kidney disease ([place]) - Primary  ?  Lab Results  ?Component Value Date  ? HGBA1C 6.7 (A) 10/16/2021  ?Good control, however has been taking Actos and patient with heart failure diagnosis.  Discussed this is not a good combination.  We will start Jardiance due to CKD 10 mg daily.  Stop Actos.  Continue metformin 1000 mg twice daily.  Return in 3 months referral to dietitian to discuss CKD and diabetes diet.  He does not want to see nephrology at this time.  Also intolerant of ACE and ARB's ?  ?  ? Relevant Medications  ? empagliflozin (JARDIANCE) 10 MG TABS tablet  ? blood glucose meter kit and supplies KIT  ? Other Relevant Orders  ? POCT HgB A1C (Completed)  ? Ambulatory referral to Nutrition and Diabetic Education  ? Hyperlipidemia associated with type 2 diabetes mellitus ([place])  ? Relevant Medications  ? empagliflozin (JARDIANCE) 10 MG TABS tablet  ?  blood glucose meter kit and supplies KIT  ? Other Relevant Orders  ? Ambulatory referral to Nutrition and Diabetic Education  ?  ? Genitourinary  ? Stage 3b chronic kidney disease ([place])  ?  We will start Jardiance 10 mg daily.  Intolerant of ACE and ARB's.  Declined nephrology referral at this time.  Repeat labs in 3 months if no improvement will reassess need for nephrologist. ?  ?  ? Relevant Medications  ? empagliflozin (JARDIANCE) 10 MG TABS tablet  ? blood glucose meter kit and supplies KIT  ? Other Relevant Orders  ? Ambulatory referral to Nutrition and Diabetic Education  ? ? ? ?Return in about 3 months (around 01/16/2022) for diabetes and kidney. ? ?Lesleigh Noe, MD ? ?This visit occurred during the SARS-CoV-2 public health emergency.  Safety protocols were in place, including screening questions prior to the visit, additional usage of  staff PPE, and extensive cleaning of exam room while observing appropriate contact time as indicated for disinfecting solutions.  ? ?

## 2021-10-16 NOTE — Assessment & Plan Note (Signed)
We will start Jardiance 10 mg daily.  Intolerant of ACE and ARB's.  Declined nephrology referral at this time.  Repeat labs in 3 months if no improvement will reassess need for nephrologist. ?

## 2021-10-16 NOTE — Patient Instructions (Addendum)
Goal Fasting CBG <140 ?Goal 2 hours after eating <160 ? ?You don't need to check every day but the above numbers should be your goal ? ?Diabetes ?1) Stop Actos ?2) Start Jardiance ?3) Continue Metformin ? ?Consider seeing Nephrology  ? ?Avoid anti-inflammatory medications like Ibuprofen and Naproxen (Aleve) to protect the kidneys ?

## 2021-10-16 NOTE — Assessment & Plan Note (Signed)
Lab Results  ?Component Value Date  ? HGBA1C 6.7 (A) 10/16/2021  ? ?Good control, however has been taking Actos and patient with heart failure diagnosis.  Discussed this is not a good combination.  We will start Jardiance due to CKD 10 mg daily.  Stop Actos.  Continue metformin 1000 mg twice daily.  Return in 3 months referral to dietitian to discuss CKD and diabetes diet.  He does not want to see nephrology at this time.  Also intolerant of ACE and ARB's ?

## 2021-11-24 ENCOUNTER — Encounter: Payer: Medicare Other | Attending: Family Medicine | Admitting: *Deleted

## 2021-11-24 ENCOUNTER — Encounter: Payer: Self-pay | Admitting: *Deleted

## 2021-11-24 VITALS — BP 100/56 | Ht 66.0 in | Wt 181.6 lb

## 2021-11-24 DIAGNOSIS — E1122 Type 2 diabetes mellitus with diabetic chronic kidney disease: Secondary | ICD-10-CM | POA: Diagnosis not present

## 2021-11-24 DIAGNOSIS — E785 Hyperlipidemia, unspecified: Secondary | ICD-10-CM | POA: Diagnosis not present

## 2021-11-24 DIAGNOSIS — Z713 Dietary counseling and surveillance: Secondary | ICD-10-CM | POA: Insufficient documentation

## 2021-11-24 DIAGNOSIS — E1169 Type 2 diabetes mellitus with other specified complication: Secondary | ICD-10-CM | POA: Diagnosis not present

## 2021-11-24 DIAGNOSIS — Z6829 Body mass index (BMI) 29.0-29.9, adult: Secondary | ICD-10-CM | POA: Insufficient documentation

## 2021-11-24 DIAGNOSIS — N1832 Chronic kidney disease, stage 3b: Secondary | ICD-10-CM | POA: Diagnosis not present

## 2021-11-24 NOTE — Progress Notes (Signed)
Diabetes Self-Management Education ? ?Visit Type: First/Initial ? ?Appt. Start Time: 1310 Appt. End Time: [id number] ? ?11/24/2021 ? ?[name], identified by name and date of birth, is a 71 y.o. male with a diagnosis of Diabetes: Type 2.  ? ?ASSESSMENT ? ?Blood pressure (!) 100/56, height 5\' 6"  (1.676 m), weight 181 lb 9.6 oz (82.4 kg). ?Body mass index is 29.31 kg/m?. ? ? Diabetes Self-Management Education - 11/24/21 1419   ? ?  ? Visit Information  ? Visit Type First/Initial   ?  ? Initial Visit  ? Diabetes Type Type 2   ? Date Diagnosed 18 years   ? Are you currently following a meal plan? No   ? Are you taking your medications as prescribed? Yes   ?  ? Health Coping  ? How would you rate your overall health? Fair   ?  ? Psychosocial Assessment  ? Patient Belief/Attitude about Diabetes Other (comment)   "weak some time"  ? What is the hardest part about your diabetes right now, causing you the most concern, or is the most worrisome to you about your diabetes?   Making healty food and beverage choices   ? Self-care barriers None   ? Self-management support Doctor's office;Family   ? Patient Concerns Nutrition/Meal planning;Glycemic Control;Medication;Monitoring;Healthy Lifestyle   ? Special Needs Simplified materials   ? Preferred Learning Style Auditory;Visual;Hands on   ? Learning Readiness Contemplating   ? How often do you need to have someone help you when you read instructions, pamphlets, or other written materials from your doctor or pharmacy? 1 - Never   ? What is the last grade level you completed in school? 12th   ?  ? Pre-Education Assessment  ? Patient understands the diabetes disease and treatment process. Needs Instruction   ? Patient understands incorporating nutritional management into lifestyle. Needs Instruction   ? Patient undertands incorporating physical activity into lifestyle. Needs Instruction   ? Patient understands using medications safely. Needs Instruction   ? Patient understands  monitoring blood glucose, interpreting and using results Needs Review   ? Patient understands prevention, detection, and treatment of acute complications. Needs Instruction   ? Patient understands prevention, detection, and treatment of chronic complications. Needs Instruction   ? Patient understands how to develop strategies to address psychosocial issues. Needs Instruction   ? Patient understands how to develop strategies to promote health/change behavior. Needs Instruction   ?  ? Complications  ? Last HgB A1C per patient/outside source 6.7 %   10/16/2021  ? How often do you check your blood sugar? 1-2 times/day   ? Postprandial Blood glucose range (mg/dL) --   He reports bedtime readings 140-150's mg/dL.  ? Have you had a dilated eye exam in the past 12 months? No   ? Have you had a dental exam in the past 12 months? No   ? Are you checking your feet? Yes   ? How many days per week are you checking your feet? 1   ?  ? Dietary Intake  ? Breakfast sometimes skips or eats eggs, grits, bacon, liver pudding   ? Snack (morning) 0-2 snacks/day - usually desserts/sweets - chocolate candy, ice cream and sometimes fruit (apple, orange, banana)   ? Lunch chicken wings, baked or sweet potato; steak and black-eyed peas   ? Dinner chicken, fish, occasional beef; potatotes, peas, beans, broccoli, green beans, collard greens, rice, sometimes salads   ? Beverage(s) water, juice, coffee, diet soda, SF lemonade   ?  ?  Activity / Exercise  ? Activity / Exercise Type Light (walking / raking leaves)   ? How many days per week do you exercise? 4   ? How many minutes per day do you exercise? 30   ? Total minutes per week of exercise 120   ?  ? Patient Education  ? Previous Diabetes Education No   "unsure"  ? Disease Pathophysiology Definition of diabetes, type 1 and 2, and the diagnosis of diabetes   ? Healthy Eating Role of diet in the treatment of diabetes and the relationship between the three main macronutrients and blood glucose  level;Food label reading, portion sizes and measuring food.;Plate Method;Information on hints to eating out and maintain blood glucose control.   ? Being Active Role of exercise on diabetes management, blood pressure control and cardiac health.   ? Medications Reviewed patients medication for diabetes, action, purpose, timing of dose and side effects.   ? Monitoring Purpose and frequency of SMBG.;Taught/discussed recording of test results and interpretation of SMBG.;Identified appropriate SMBG and/or A1C goals.   ? Chronic complications Relationship between chronic complications and blood glucose control   ? Diabetes Stress and Support Identified and addressed patients feelings and concerns about diabetes;Role of stress on diabetes   ?  ? Individualized Goals (developed by patient)  ? Reducing Risk Other (comment)   improve blood sugars, decease medications, prevent diabetes complications, lead a healthier lifestyle  ?  ? Outcomes  ? Expected Outcomes Demonstrated interest in learning but significant barriers to change   ? Program Status Not Completed   ? ?  ?  ? ?Individualized Plan for Diabetes Self-Management Training:  ? ?Learning Objective:  Patient will have a greater understanding of diabetes self-management. ?Patient education plan is to attend individual and/or group sessions per assessed needs and concerns. ?  ?Plan:  ? ?Patient Instructions  ?Check blood sugars before breakfast or 2 hours after supper 3-4 x week ? ?Exercise:  Continue walking for    30  minutes   4  days a week and gradually increase to 30 minutes 5 x week ? ?Eat 3 meals day,   1-2  snacks a day ?Space meals 4-6 hours apart ?Don't skip meals - eat 1 serving of protein and 1 serving of carbohydrate ?Avoid sugar sweetened drinks (juices) ?Limit fried foods and chocolate candy 3-4 x week instead of daily ? ?Call back if you want to schedule an appointment with the nurse or dietitian ? ?Expected Outcomes:  Demonstrated interest in learning  but significant barriers to change ? ?Education material provided:  ?Metallurgist Guidelines ?Simple Meal Plan ?Diabetes Plate Method (ADA)  ? ?If problems or questions, patient to contact team via:   ?Johny Drilling, RN, [place], [place] [phone] ? ?Future DSME appointment:  PRN ?

## 2021-11-24 NOTE — Patient Instructions (Addendum)
Check blood sugars before breakfast or 2 hours after supper 3-4 x week ? ?Exercise:  Continue walking for    30  minutes   4  days a week and gradually increase to 30 minutes 5 x week ? ?Eat 3 meals day,   1-2  snacks a day ?Space meals 4-6 hours apart ?Don't skip meals - eat 1 serving of protein and 1 serving of carbohydrate ?Avoid sugar sweetened drinks (juices) ?Limit fried foods and chocolate candy 3-4 x week instead of daily ? ?Call back if you want to schedule an appointment with the nurse or dietitian ?

## 2022-01-09 ENCOUNTER — Other Ambulatory Visit: Payer: Self-pay | Admitting: Family Medicine

## 2022-01-09 DIAGNOSIS — I152 Hypertension secondary to endocrine disorders: Secondary | ICD-10-CM

## 2022-02-09 NOTE — Telephone Encounter (Signed)
error 

## 2022-03-07 ENCOUNTER — Ambulatory Visit (INDEPENDENT_AMBULATORY_CARE_PROVIDER_SITE_OTHER): Payer: Medicare Other | Admitting: Cardiology

## 2022-03-07 ENCOUNTER — Encounter: Payer: Self-pay | Admitting: Cardiology

## 2022-03-07 VITALS — BP 152/78 | HR 61 | Ht 66.0 in | Wt 184.4 lb

## 2022-03-07 DIAGNOSIS — I255 Ischemic cardiomyopathy: Secondary | ICD-10-CM

## 2022-03-07 DIAGNOSIS — E1159 Type 2 diabetes mellitus with other circulatory complications: Secondary | ICD-10-CM | POA: Diagnosis not present

## 2022-03-07 DIAGNOSIS — I5022 Chronic systolic (congestive) heart failure: Secondary | ICD-10-CM | POA: Diagnosis not present

## 2022-03-07 DIAGNOSIS — I152 Hypertension secondary to endocrine disorders: Secondary | ICD-10-CM

## 2022-03-07 NOTE — Patient Instructions (Signed)
Medication Instructions:  Restart your home hydrochlorothiazide *If you need a refill on your cardiac medications before your next appointment, please call your pharmacy*   Lab Work: none If you have labs (blood work) drawn today and your tests are completely normal, you will receive your results only by: MyChart Message (if you have MyChart) OR A paper copy in the mail If you have any lab test that is abnormal or we need to change your treatment, we will call you to review the results.   Testing/Procedures: none   Follow-Up: At [facility], you and your health needs are our priority.  As part of our continuing mission to provide you with exceptional heart care, we have created designated Provider Care Teams.  These Care Teams include your primary Cardiologist (physician) and Advanced Practice Providers (APPs -  Physician Assistants and Nurse Practitioners) who all work together to provide you with the care you need, when you need it.  We recommend signing up for the patient portal called "MyChart".  Sign up information is provided on this After Visit Summary.  MyChart is used to connect with patients for Virtual Visits (Telemedicine).  Patients are able to view lab/test results, encounter notes, upcoming appointments, etc.  Non-urgent messages can be sent to your provider as well.   To learn more about what you can do with MyChart, go to [email or web address].    Your next appointment:   1 year(s)  The format for your next appointment:   In Person  Provider:   You will see one of the following Advanced Practice Providers on your designated Care Team:   Nicolasa Ducking, NP Eula Listen, PA-C Cadence Fransico Michael, PA-C  Then, Dr Lalla Brothers will plan to see you again As needed .    Other Instructions NA  Important Information About Sugar

## 2022-03-07 NOTE — Progress Notes (Signed)
Electrophysiology Office Follow up Visit Note:    Date:  03/07/2022   ID:  Donald Skinner, DOB 18-Jan-1951, MRN [id number]  PCP:  Donald Noe, MD  [facility] Cardiologist:  None  [facility] Electrophysiologist:  Donald Epley, MD    Interval History:    Donald Skinner is a 71 y.o. male who presents for a follow up visit. They were last seen in clinic 06/28/2021 for chronic systolic heart failure. He previously was scheduled for ICD implant but arrived confused about what we were to do that day. I met with him in clinic in November and he wanted more time to think about the procedure before proceeding.  Today he tells me that he is not ready to proceed with ICD implant. He recently stopped taking his HCTZ because he thought his blood pressure had improved with exercise. He feels overall well without syncope, dizziness, SOB, CP.       Past Medical History:  Diagnosis Date   Allergy    Diabetes mellitus without complication ([place])    Heart disease    Hyperlipidemia    Hypertension    Myocardial infarction [facility])     Past Surgical History:  Procedure Laterality Date   CARDIAC CATHETERIZATION     Cardiac stents     x 6 stents- had 5 heart attacks   RIGHT/LEFT HEART CATH AND CORONARY ANGIOGRAPHY N/A 01/06/2021   Procedure: RIGHT/LEFT HEART CATH AND CORONARY ANGIOGRAPHY;  Surgeon: Nelva Bush, MD;  Location: [place] CV LAB;  Service: Cardiovascular;  Laterality: N/A;    Current Medications: Current Meds  Medication Sig   aspirin EC 81 MG tablet Take 81 mg by mouth daily. Swallow whole.   atorvastatin (LIPITOR) 80 MG tablet TAKE 1 TABLET(80 MG) BY MOUTH DAILY   carvedilol (COREG) 25 MG tablet TAKE 1 TABLET(25 MG) BY MOUTH TWICE DAILY WITH A MEAL   clopidogrel (PLAVIX) 75 MG tablet TAKE 1 TABLET(75 MG) BY MOUTH DAILY   hydrochlorothiazide (HYDRODIURIL) 25 MG tablet Take 25 mg by mouth daily.   metFORMIN (GLUCOPHAGE) 500 MG tablet Take 2 tablets (1,000 mg total)  by mouth 2 (two) times daily with a meal.   nitroGLYCERIN (NITROSTAT) 0.4 MG SL tablet Place 0.4 mg under the tongue every 5 (five) minutes as needed for chest pain.   pioglitazone (ACTOS) 45 MG tablet Take 45 mg by mouth daily.     Allergies:   Imdur [isosorbide nitrate], Lipitor [atorvastatin], Losartan potassium, Lisinopril, Rosuvastatin, and Simvastatin   Social History   Socioeconomic History   Marital status: Divorced    Spouse name: Not on file   Number of children: 4   Years of education: trade school   Highest education level: Not on file  Occupational History   Not on file  Tobacco Use   Smoking status: Never   Smokeless tobacco: Never  Vaping Use   Vaping Use: Never used  Substance and Sexual Activity   Alcohol use: Not Currently   Drug use: Never   Sexual activity: Not Currently  Other Topics Concern   Not on file  Social History Narrative   12/07/19   From: near [place], [place] - moved to be near to daughter   Living: moving out of his daughters - will be living alone   Work: retired due to disability from Engineer, 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      Family: daughter - Roland Rack nearby (nurse), Donald Skinner, has 8 grandchildren      Enjoys: travel,  sailing, boating      Exercise: not currently - was walking and bicycling   Diet: has been gaining weight, not doing good with diet - tries to do low carb      Safety   Seat belts: Yes    Guns: No   Safe in relationships: Yes    Social Determinants of Health   Financial Resource Strain: Low Risk  (09/13/2021)   Overall Financial Resource Strain (CARDIA)    Difficulty of Paying Living Expenses: Not hard at all  Food Insecurity: No Food Insecurity (09/13/2021)   Hunger Vital Sign    Worried About Running Out of Food in the Last Year: Never true    Ran Out of Food in the Last Year: Never true  Transportation Needs: No Transportation Needs (09/13/2021)   PRAPARE - Hydrologist  (Medical): No    Lack of Transportation (Non-Medical): No  Physical Activity: Inactive (09/13/2021)   Exercise Vital Sign    Days of Exercise per Week: 0 days    Minutes of Exercise per Session: 0 min  Stress: No Stress Concern Present (09/13/2021)   [place]    Feeling of Stress : Not at all  Social Connections: Moderately Isolated (09/13/2021)   Social Connection and Isolation Panel [NHANES]    Frequency of Communication with Friends and Family: Never    Frequency of Social Gatherings with Friends and Family: More than three times a week    Attends Religious Services: More than 4 times per year    Active Member of [employer] or Organizations: No    Attends Music therapist: Never    Marital Status: Divorced     Family History: The patient's family history includes Alcohol abuse in his brother, brother, father, and mother; Arthritis in his mother; Drug abuse in his brother; Heart attack (age of onset: 37) in his father; Hypertension in his father; Mental illness in his brother.  ROS:   Please see the history of present illness.    All other systems reviewed and are negative.  EKGs/Labs/Other Studies Reviewed:    The following studies were reviewed today:   ECG today shows sinus rhythm.  Recent Labs: 06/09/2021: BNP 308.0 09/18/2021: ALT 16; BUN 25; Creatinine, Ser 1.81; Hemoglobin 12.7; Platelets 137.0; Potassium 4.4; Sodium 140  Recent Lipid Panel    Component Value Date/Time   CHOL 137 09/18/2021 1516   TRIG 130.0 09/18/2021 1516   HDL 47.40 09/18/2021 1516   CHOLHDL 3 09/18/2021 1516   VLDL 26.0 09/18/2021 1516   LDLCALC 64 09/18/2021 1516    Physical Exam:    VS:  BP (!) 152/78   Pulse 61   Ht 5' 6" (1.676 m)   Wt 184 lb 6 oz (83.6 kg)   SpO2 98%   BMI 29.76 kg/m     Wt Readings from Last 3 Encounters:  03/07/22 184 lb 6 oz (83.6 kg)  11/24/21 181 lb 9.6 oz (82.4 kg)  10/16/21 190  lb 9.6 oz (86.5 kg)     GEN:  Well nourished, well developed in no acute distress HEENT: Normal NECK: No JVD; No carotid bruits LYMPHATICS: No lymphadenopathy CARDIAC: RRR, no murmurs, rubs, gallops RESPIRATORY:  Clear to auscultation without rales, wheezing or rhonchi  ABDOMEN: Soft, non-tender, non-distended MUSCULOSKELETAL:  No edema; No deformity  SKIN: Warm and dry NEUROLOGIC:  Alert and oriented x 3 PSYCHIATRIC:  Normal affect  ASSESSMENT:    1. Chronic HFrEF (heart failure with reduced ejection fraction) (HCC)   2. Ischemic cardiomyopathy   3. Hypertension associated with diabetes (HCC)    PLAN:    In order of problems listed above:  #Chronic systolic heart failure NYHA II-III. Warm and dry. On good medical therapy. I again discussed his risk of SCD and ICD implant. He has declined ICD.  #HTN Above goal on recheck x 2. He has not been taking his HCTZ. I will have him restart it and follow up with Dr Cody in clinic. Recommend checking blood pressures 1-2 times per week at home and recording the values.  Recommend bringing these recordings to the primary care physician.  Follow up 1 year with cardiology. Follow up with EP as needed.  Note sent to Dr. Cody, primary care physician.   Medication Adjustments/Labs and Tests Ordered: Current medicines are reviewed at length with the patient today.  Concerns regarding medicines are outlined above.  No orders of the defined types were placed in this encounter.  No orders of the defined types were placed in this encounter.    Signed, Cameron Lambert, MD, FACC, FHRS 03/07/2022 3:25 PM    Electrophysiology [facility] 

## 2022-03-08 NOTE — Progress Notes (Signed)
LVM for patient to give us a call back to schedule.  

## 2022-03-15 NOTE — Progress Notes (Signed)
Sent a MyChart message over to patient to give [place] a call.

## 2022-03-21 ENCOUNTER — Telehealth: Payer: Self-pay | Admitting: Urology

## 2022-03-21 NOTE — Telephone Encounter (Signed)
Patient declined to schedule his MRI Per scheduling 03/21/22~DO NOT SCHD per patient, due to bad Kidneys & that he will advise Dr Apolinar Junes of his decision. MF

## 2022-04-18 ENCOUNTER — Ambulatory Visit: Payer: Medicare Other | Admitting: Urology

## 2022-04-18 ENCOUNTER — Telehealth: Payer: Self-pay | Admitting: Urology

## 2022-04-18 NOTE — Telephone Encounter (Signed)
This patient was a no-show today for his follow-up renal mass.  There was a note in the chart indicating that he did not want to schedule this due to having "bad kidneys".  Please follow-up with him and have him clarify what he means.  He did have a blood work test in February indicating a decline in his renal function but it should not preclude MRI or we can do study without contrast if needed if labs have since worsened.   Strongly recommend continued f/u.  Hollice Espy, MD

## 2022-04-18 NOTE — Telephone Encounter (Signed)
Left message for patient to call back to discuss missed appointment.

## 2022-04-24 ENCOUNTER — Ambulatory Visit (INDEPENDENT_AMBULATORY_CARE_PROVIDER_SITE_OTHER): Payer: Medicare Other | Admitting: Urology

## 2022-04-24 ENCOUNTER — Encounter: Payer: Self-pay | Admitting: Urology

## 2022-04-24 VITALS — BP 117/71 | HR 64 | Ht 66.0 in | Wt 178.0 lb

## 2022-04-24 DIAGNOSIS — N2889 Other specified disorders of kidney and ureter: Secondary | ICD-10-CM

## 2022-04-24 DIAGNOSIS — N281 Cyst of kidney, acquired: Secondary | ICD-10-CM | POA: Diagnosis not present

## 2022-04-24 NOTE — Progress Notes (Unsigned)
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  Renal ultrasound performed 09/30/2019 which was performed in [place] remarkable for a 6.3 x 6.3 x 6.8 cm left renal mass. Prior MRI from 05/30/2018 showed mass measuring 7.1 x 7.4 cm. Images not available but felt highly suspicious for renal cell carcinoma.   Abdominal MRI w/w/o contrast on 02/22/2020 showed 7.8 cm complex hemorrhagic cystic lesion in the lateral midpole of the left kidney.  A benign hemorrhagic cyst is favored. No evidence of abdominal metastatic disease. Colonic diverticulosis.  MRI performed on 09/23/2018 showed slight interval enlargement up to 8.2 cm but there is no definitive enhancement on today's study and per the radiologist, it is favoring a benign hemorrhagic cyst.  There are some complexity to the cyst.  He another follow-up MRI in 03/2021 but no showed for this follow-up.  Please continue to demonstrate a stable left minimally enlarged renal lesion but secondary to size of complexity with a Bosniak 56F classification, MR was recommended in a year.  He follows up today without any imaging study.  He canceled his appointment several times and indicated that he did not want to have the study because he felt like he had "bad kidneys".  In exploring that further today, he mentions that he has heard that IV dye can be hard on the kidneys and already has some renal dysfunction.  He is worried that the contrast from the study is too much since he has had contrast for other studies including for cardiac evaluation.  He denies any flank pain or hematuria.  Most recent creatinine 1.81, GFR 36    PMH: Past Medical History:  Diagnosis Date   Allergy     Diabetes mellitus without complication ([place])    Heart disease    Hyperlipidemia    Hypertension    Myocardial infarction [facility])     Surgical History: Past Surgical History:  Procedure Laterality Date   CARDIAC CATHETERIZATION     Cardiac stents     x 6 stents- had 5 heart attacks   RIGHT/LEFT HEART CATH AND CORONARY ANGIOGRAPHY N/A 01/06/2021   Procedure: RIGHT/LEFT HEART CATH AND CORONARY ANGIOGRAPHY;  Surgeon: Nelva Bush, MD;  Location: [place] [facility];  Service: Cardiovascular;  Laterality: N/A;    Home Medications:  Allergies as of 04/24/2022       Reactions   Imdur [isosorbide Nitrate]    headache   Lipitor [atorvastatin]    myalgia   Losartan Potassium Swelling   Tongue swelling   Lisinopril Cough   Rosuvastatin Other (See Comments)   myalgias   Simvastatin Other (See Comments)   weakness        Medication List        Accurate as of April 24, 2022  3:09 PM. If you have any questions, ask your nurse or doctor.          aspirin EC 81 MG tablet Take 81 mg by mouth daily. Swallow whole.   atorvastatin 80 MG tablet Commonly known as: LIPITOR TAKE 1 TABLET(80 MG) BY MOUTH DAILY   blood glucose meter kit and supplies Dispense based on patient and insurance preference. Use up to four times daily as directed. (FOR  ICD-10 E10.9, E11.9).   blood glucose meter kit and supplies Kit Dispense based on patient and insurance preference. Use up to four times daily as directed.   carvedilol 25 MG tablet Commonly known as: COREG TAKE 1 TABLET(25 MG) BY MOUTH TWICE DAILY WITH A MEAL   clopidogrel 75 MG tablet Commonly known as: PLAVIX TAKE 1 TABLET(75 MG) BY MOUTH DAILY   empagliflozin 10 MG Tabs tablet Commonly known as: JARDIANCE Take 1 tablet (10 mg total) by mouth daily before breakfast.   hydrochlorothiazide 25 MG tablet Commonly known as: HYDRODIURIL Take 25 mg by mouth daily.   metFORMIN 500 MG tablet Commonly known as: GLUCOPHAGE Take  2 tablets (1,000 mg total) by mouth 2 (two) times daily with a meal.   nitroGLYCERIN 0.4 MG SL tablet Commonly known as: NITROSTAT Place 0.4 mg under the tongue every 5 (five) minutes as needed for chest pain.   pioglitazone 45 MG tablet Commonly known as: ACTOS Take 45 mg by mouth daily.   triamcinolone cream 0.1 % Commonly known as: KENALOG SMARTSIG:1 Application Topical 2-3 Times Daily        Allergies:  Allergies  Allergen Reactions   Imdur [Isosorbide Nitrate]     headache   Lipitor [Atorvastatin]     myalgia   Losartan Potassium Swelling    Tongue swelling   Lisinopril Cough   Rosuvastatin Other (See Comments)    myalgias   Simvastatin Other (See Comments)    weakness    Family History: Family History  Problem Relation Age of Onset   Alcohol abuse Mother    Arthritis Mother    Alcohol abuse Father    Heart attack Father 45   Hypertension Father    Alcohol abuse Brother    Drug abuse Brother    Alcohol abuse Brother    Mental illness Brother     Social History:  reports that he has never smoked. He has never used smokeless tobacco. He reports that he does not currently use alcohol. He reports that he does not use drugs.   Physical Exam: BP 117/71   Pulse 64   Ht 5' 6" (1.676 m)   Wt 178 lb (80.7 kg)   BMI 28.73 kg/m   Constitutional:  Alert and oriented, No acute distress. HEENT: [place] AT, moist mucus membranes.  Trachea midline, no masses. Neurologic: Grossly intact, no focal deficits, moving all 4 extremities. Psychiatric: Normal mood and affect.  Laboratory Data: Lab Results  Component Value Date   WBC 4.7 09/18/2021   HGB 12.7 (L) 09/18/2021   HCT 39.3 09/18/2021   MCV 87.6 09/18/2021   PLT 137.0 (L) 09/18/2021    Lab Results  Component Value Date   CREATININE 1.81 (H) 09/18/2021    Lab Results  Component Value Date   PSA 0.3 09/29/2019    Lab Results  Component Value Date   HGBA1C 6.7 (A) 10/16/2021    Assessment & Plan:     1.  Left Bosniak 2F renal cyst Based on size and complexity, interval imaging is highly been recommended  We discussed today that the contrast with MRI is not nephrotoxic, theoretical risk of systemic fibrosis of GFR is less than 30 but we can dose reduce and minimize this risk if need be.  We will check a creatinine before the study.  After discussing risk and benefits, he is now agreeable to proceed as planned.  Plan for MRI of the abdomen with and without contrast, will call with results and   follow-up plan based on the imaging findings  Hollice Espy, MD  [facility] [address] [place], [place] [address] [phone]

## 2022-07-11 ENCOUNTER — Ambulatory Visit
Admission: RE | Admit: 2022-07-11 | Discharge: 2022-07-11 | Disposition: A | Discharge status: Home / Self Care | Payer: Medicare Other | Source: Ambulatory Visit | Attending: Internal Medicine | Admitting: Internal Medicine

## 2022-07-11 ENCOUNTER — Other Ambulatory Visit: Payer: Self-pay | Admitting: Internal Medicine

## 2022-07-11 DIAGNOSIS — R1084 Generalized abdominal pain: Secondary | ICD-10-CM

## 2022-10-22 ENCOUNTER — Telehealth: Payer: Self-pay | Admitting: Family Medicine

## 2022-10-22 NOTE — Telephone Encounter (Signed)
Contacted Donald Skinner to schedule their annual wellness visit. Patient declined to schedule AWV at this time. Transferred care.  [place] Direct Dial: [phone]
